# Patient Record
Sex: Female | Born: 1955 | Race: Black or African American | Hispanic: No | Marital: Married | Smoking: Never smoker
Health system: Southern US, Community
[De-identification: ages and names within clinical notes are randomized; demographics above are authoritative.]

## PROBLEM LIST (undated history)

## (undated) DIAGNOSIS — Z860101 Personal history of adenomatous and serrated colon polyps: Secondary | ICD-10-CM

## (undated) DIAGNOSIS — Z86010 Personal history of colonic polyps: Secondary | ICD-10-CM

## (undated) DIAGNOSIS — R3915 Urgency of urination: Secondary | ICD-10-CM

## (undated) DIAGNOSIS — S92351A Displaced fracture of fifth metatarsal bone, right foot, initial encounter for closed fracture: Secondary | ICD-10-CM

## (undated) DIAGNOSIS — F419 Anxiety disorder, unspecified: Secondary | ICD-10-CM

## (undated) DIAGNOSIS — G35 Multiple sclerosis: Secondary | ICD-10-CM

## (undated) DIAGNOSIS — G709 Myoneural disorder, unspecified: Secondary | ICD-10-CM

## (undated) DIAGNOSIS — I471 Supraventricular tachycardia: Secondary | ICD-10-CM

## (undated) DIAGNOSIS — Z8742 Personal history of other diseases of the female genital tract: Secondary | ICD-10-CM

## (undated) DIAGNOSIS — N201 Calculus of ureter: Secondary | ICD-10-CM

## (undated) HISTORY — DX: Multiple sclerosis: G35

## (undated) HISTORY — PX: COLONOSCOPY: SHX174

## (undated) HISTORY — DX: Supraventricular tachycardia: I47.1

---

## 1999-02-07 ENCOUNTER — Ambulatory Visit (HOSPITAL_COMMUNITY): Admission: RE | Admit: 1999-02-07 | Discharge: 1999-02-07 | Payer: Self-pay | Admitting: Gastroenterology

## 1999-11-03 ENCOUNTER — Other Ambulatory Visit: Admission: RE | Admit: 1999-11-03 | Discharge: 1999-11-03 | Payer: Self-pay | Admitting: Obstetrics and Gynecology

## 2000-12-24 ENCOUNTER — Other Ambulatory Visit: Admission: RE | Admit: 2000-12-24 | Discharge: 2000-12-24 | Payer: Self-pay | Admitting: Gynecology

## 2001-12-29 ENCOUNTER — Other Ambulatory Visit: Admission: RE | Admit: 2001-12-29 | Discharge: 2001-12-29 | Payer: Self-pay | Admitting: Gynecology

## 2002-11-02 ENCOUNTER — Ambulatory Visit (HOSPITAL_COMMUNITY): Admission: RE | Admit: 2002-11-02 | Discharge: 2002-11-02 | Payer: Self-pay | Admitting: Neurology

## 2002-11-02 ENCOUNTER — Encounter: Payer: Self-pay | Admitting: Neurology

## 2002-11-26 ENCOUNTER — Ambulatory Visit (HOSPITAL_COMMUNITY): Admission: RE | Admit: 2002-11-26 | Discharge: 2002-11-26 | Payer: Self-pay | Admitting: Neurology

## 2003-01-01 ENCOUNTER — Other Ambulatory Visit: Admission: RE | Admit: 2003-01-01 | Discharge: 2003-01-01 | Payer: Self-pay | Admitting: Gynecology

## 2003-05-14 ENCOUNTER — Other Ambulatory Visit: Admission: RE | Admit: 2003-05-14 | Discharge: 2003-05-14 | Payer: Self-pay | Admitting: Gynecology

## 2003-10-12 ENCOUNTER — Other Ambulatory Visit: Admission: RE | Admit: 2003-10-12 | Discharge: 2003-10-12 | Payer: Self-pay | Admitting: Gynecology

## 2004-01-27 ENCOUNTER — Other Ambulatory Visit: Admission: RE | Admit: 2004-01-27 | Discharge: 2004-01-27 | Payer: Self-pay | Admitting: Gynecology

## 2004-02-15 ENCOUNTER — Encounter (INDEPENDENT_AMBULATORY_CARE_PROVIDER_SITE_OTHER): Payer: Self-pay | Admitting: Specialist

## 2004-02-15 ENCOUNTER — Ambulatory Visit (HOSPITAL_COMMUNITY): Admission: RE | Admit: 2004-02-15 | Discharge: 2004-02-15 | Payer: Self-pay | Admitting: Gastroenterology

## 2004-05-22 ENCOUNTER — Other Ambulatory Visit: Admission: RE | Admit: 2004-05-22 | Discharge: 2004-05-22 | Payer: Self-pay | Admitting: Gynecology

## 2005-07-10 ENCOUNTER — Other Ambulatory Visit: Admission: RE | Admit: 2005-07-10 | Discharge: 2005-07-10 | Payer: Self-pay | Admitting: Gynecology

## 2006-01-01 ENCOUNTER — Other Ambulatory Visit: Admission: RE | Admit: 2006-01-01 | Discharge: 2006-01-01 | Payer: Self-pay | Admitting: Gynecology

## 2006-03-01 ENCOUNTER — Encounter: Admission: RE | Admit: 2006-03-01 | Discharge: 2006-03-01 | Payer: Self-pay | Admitting: Family Medicine

## 2006-04-09 ENCOUNTER — Other Ambulatory Visit: Admission: RE | Admit: 2006-04-09 | Discharge: 2006-04-09 | Payer: Self-pay | Admitting: Gynecology

## 2006-07-11 ENCOUNTER — Other Ambulatory Visit: Admission: RE | Admit: 2006-07-11 | Discharge: 2006-07-11 | Payer: Self-pay | Admitting: Gynecology

## 2006-10-11 ENCOUNTER — Other Ambulatory Visit: Admission: RE | Admit: 2006-10-11 | Discharge: 2006-10-11 | Payer: Self-pay | Admitting: Gynecology

## 2007-03-04 ENCOUNTER — Encounter: Admission: RE | Admit: 2007-03-04 | Discharge: 2007-03-04 | Payer: Self-pay | Admitting: Family Medicine

## 2007-06-18 ENCOUNTER — Other Ambulatory Visit: Admission: RE | Admit: 2007-06-18 | Discharge: 2007-06-18 | Payer: Self-pay | Admitting: Gynecology

## 2007-09-08 ENCOUNTER — Other Ambulatory Visit: Admission: RE | Admit: 2007-09-08 | Discharge: 2007-09-08 | Payer: Self-pay | Admitting: Gynecology

## 2008-03-11 ENCOUNTER — Encounter: Admission: RE | Admit: 2008-03-11 | Discharge: 2008-03-11 | Payer: Self-pay | Admitting: Family Medicine

## 2008-04-15 ENCOUNTER — Encounter: Admission: RE | Admit: 2008-04-15 | Discharge: 2008-04-15 | Payer: Self-pay | Admitting: Family Medicine

## 2008-05-25 ENCOUNTER — Other Ambulatory Visit: Admission: RE | Admit: 2008-05-25 | Discharge: 2008-05-25 | Payer: Self-pay | Admitting: Gynecology

## 2008-11-18 ENCOUNTER — Other Ambulatory Visit: Admission: RE | Admit: 2008-11-18 | Discharge: 2008-11-18 | Payer: Self-pay | Admitting: Gynecology

## 2008-11-18 ENCOUNTER — Ambulatory Visit: Payer: Self-pay | Admitting: Gynecology

## 2008-11-18 ENCOUNTER — Encounter: Payer: Self-pay | Admitting: Gynecology

## 2009-03-18 ENCOUNTER — Encounter: Admission: RE | Admit: 2009-03-18 | Discharge: 2009-03-18 | Payer: Self-pay | Admitting: Family Medicine

## 2009-05-17 ENCOUNTER — Ambulatory Visit: Payer: Self-pay | Admitting: Gynecology

## 2009-05-17 ENCOUNTER — Other Ambulatory Visit: Admission: RE | Admit: 2009-05-17 | Discharge: 2009-05-17 | Payer: Self-pay | Admitting: Gynecology

## 2009-05-17 ENCOUNTER — Encounter: Payer: Self-pay | Admitting: Gynecology

## 2009-05-19 ENCOUNTER — Ambulatory Visit: Payer: Self-pay | Admitting: Gynecology

## 2009-07-14 ENCOUNTER — Encounter: Admission: RE | Admit: 2009-07-14 | Discharge: 2009-07-14 | Payer: Self-pay | Admitting: Obstetrics and Gynecology

## 2010-03-20 ENCOUNTER — Encounter: Admission: RE | Admit: 2010-03-20 | Discharge: 2010-03-20 | Payer: Self-pay | Admitting: Family Medicine

## 2011-02-12 ENCOUNTER — Other Ambulatory Visit: Payer: Self-pay | Admitting: Family Medicine

## 2011-02-12 DIAGNOSIS — Z1231 Encounter for screening mammogram for malignant neoplasm of breast: Secondary | ICD-10-CM

## 2011-03-16 NOTE — Op Note (Signed)
   NAME:  Barbara Ellison, Barbara Ellison                        ACCOUNT NO.:  [id number]   MEDICAL RECORD NO.:  [id number]                   PATIENT TYPE:  OUT   LOCATION:  MDC                                  FACILITY:  [facility]   PHYSICIAN:  Genene Churn. Love, M.D.                 DATE OF BIRTH:  01-28-1956   DATE OF PROCEDURE:  11/26/2002  DATE OF DISCHARGE:                                 OPERATIVE REPORT   CLINICAL INFORMATION:  This patient is being evaluated for the possibility  of a demyelinating disorder.   DESCRIPTION OF PROCEDURE:  The patient is being prepped and draped in left  lateral decubitus position.  Using Betadine and 1% Xylocaine the L3 and L4  interspace was entered without difficulty.  Opening pressure was 100 mmH2O  and clear, colorless CSF was obtained and sent for VDRL, angiotensin  converting enzyme, protein, glucose, cell count, diff, IgG, oligoclonal IgG.  One tube is to be held.  The patient tolerated the procedure well.                                                Genene Churn. Sandria Manly, M.D.    JML/MEDQ  D:  11/26/2002  T:  11/26/2002  Job:  [id number]

## 2011-03-16 NOTE — Op Note (Signed)
NAME:  Barbara Ellison, Barbara Ellison                        ACCOUNT NO.:  [id number]   MEDICAL RECORD NO.:  [id number]                   PATIENT TYPE:  AMB   LOCATION:  ENDO                                 FACILITY:  [facility]   PHYSICIAN:  John C. Madilyn Fireman, M.D.                 DATE OF BIRTH:  [birth year]   DATE OF PROCEDURE:  02/15/2004  DATE OF DISCHARGE:                                 OPERATIVE REPORT   PROCEDURE:  Colonoscopy.   INDICATIONS FOR PROCEDURE:  Family history of colon cancer in a first degree  relative.   DESCRIPTION OF PROCEDURE:  The patient was placed in the left lateral  decubitus position then placed on the pulse monitor with continuous low flow  oxygen delivered by nasal cannula. She was sedated with 87.5 mcg IV fentanyl  and 8 mg IV Versed. The Olympus video colonoscope was inserted into the  rectum and advanced to the cecum, confirmed by transillumination at  McBurney's point and visualization of the ileocecal valve and appendiceal  orifice. The prep was excellent. The cecum, ascending, transverse and  descending colon all appeared normal with no masses, polyps, diverticula or  other mucosal abnormalities. Within the sigmoid colon, there was seen a 5 mm  sessile polyp which was fulgurated by hot biopsy. The remainder of the  sigmoid and rectum appeared normal.  The scope was then withdrawn and the  patient returned to the recovery room in stable condition.  She tolerated  the procedure well and there were no immediate complications.   IMPRESSION:  Small sigmoid colon polyp.   PLAN:  Await histology and will probably repeat colonoscopy in five years.                                               John C. Madilyn Fireman, M.D.    JCH/MEDQ  D:  02/15/2004  T:  02/15/2004  Job:  [id number]   cc:   Chales Salmon. Abigail Miyamoto, M.D.  [address]  [place]  [place] [address]  Fax: [phone]

## 2011-03-22 ENCOUNTER — Ambulatory Visit
Admission: RE | Admit: 2011-03-22 | Discharge: 2011-03-22 | Disposition: A | Discharge status: Home / Self Care | Payer: 59 | Source: Ambulatory Visit | Attending: Family Medicine | Admitting: Family Medicine

## 2011-03-22 DIAGNOSIS — Z1231 Encounter for screening mammogram for malignant neoplasm of breast: Secondary | ICD-10-CM

## 2011-05-21 ENCOUNTER — Other Ambulatory Visit: Payer: Self-pay | Admitting: Obstetrics and Gynecology

## 2011-05-21 DIAGNOSIS — D219 Benign neoplasm of connective and other soft tissue, unspecified: Secondary | ICD-10-CM

## 2011-05-25 ENCOUNTER — Ambulatory Visit
Admission: RE | Admit: 2011-05-25 | Discharge: 2011-05-25 | Disposition: A | Discharge status: Home / Self Care | Payer: 59 | Source: Ambulatory Visit | Attending: Obstetrics and Gynecology | Admitting: Obstetrics and Gynecology

## 2011-05-25 DIAGNOSIS — D219 Benign neoplasm of connective and other soft tissue, unspecified: Secondary | ICD-10-CM

## 2011-05-25 MED ORDER — IOHEXOL 300 MG/ML  SOLN
100.0000 mL | Freq: Once | INTRAMUSCULAR | Status: AC | PRN
  Administered 2011-05-25: 100 mL via INTRAVENOUS

## 2012-02-27 ENCOUNTER — Other Ambulatory Visit: Payer: Self-pay | Admitting: Family Medicine

## 2012-02-27 DIAGNOSIS — Z1231 Encounter for screening mammogram for malignant neoplasm of breast: Secondary | ICD-10-CM

## 2012-04-04 ENCOUNTER — Ambulatory Visit
Admission: RE | Admit: 2012-04-04 | Discharge: 2012-04-04 | Disposition: A | Discharge status: Home / Self Care | Payer: 59 | Source: Ambulatory Visit | Attending: Family Medicine | Admitting: Family Medicine

## 2012-04-04 DIAGNOSIS — Z1231 Encounter for screening mammogram for malignant neoplasm of breast: Secondary | ICD-10-CM

## 2012-05-13 DIAGNOSIS — Z8719 Personal history of other diseases of the digestive system: Secondary | ICD-10-CM | POA: Insufficient documentation

## 2012-05-14 ENCOUNTER — Ambulatory Visit: Payer: Self-pay | Admitting: Obstetrics and Gynecology

## 2012-06-03 ENCOUNTER — Other Ambulatory Visit: Payer: Self-pay | Admitting: Family Medicine

## 2012-06-03 ENCOUNTER — Other Ambulatory Visit (HOSPITAL_COMMUNITY)
Admission: RE | Admit: 2012-06-03 | Discharge: 2012-06-03 | Disposition: A | Discharge status: Home / Self Care | Payer: 59 | Source: Ambulatory Visit | Attending: Family Medicine | Admitting: Family Medicine

## 2012-06-03 DIAGNOSIS — Z124 Encounter for screening for malignant neoplasm of cervix: Secondary | ICD-10-CM | POA: Insufficient documentation

## 2012-11-10 ENCOUNTER — Telehealth: Payer: Self-pay | Admitting: Pulmonary Disease

## 2012-11-11 NOTE — Telephone Encounter (Signed)
lmomtcb x1 to schedule APPT w/ SN

## 2012-11-11 NOTE — Telephone Encounter (Signed)
Per SN---ok to schedule her to see SN in the next 2-3 weeks.   She will need to bring in all of her old records from her past doctor along with copies of her old labs.  thanks

## 2012-11-11 NOTE — Telephone Encounter (Signed)
Spouse returned call. I advised per Mindy to bring meds / old records / labs and any cxr's that may have been done. She is on the schedule for 11-25-12 @ 2:30. Nothing further needed per spouse. Hazel Sams

## 2012-11-24 ENCOUNTER — Telehealth: Payer: Self-pay | Admitting: Pulmonary Disease

## 2012-11-24 NOTE — Telephone Encounter (Signed)
PT reports that she has seen Dr Beverley Fiedler as her primary md and has talked with her about the knot on her side but she has not referred her to anyone about this.  Pt informed that her insurance may not cover her seeing another primary md for the same problem.  Pt states that she is going to check on this and call us back regarding appt for tomorrow.

## 2012-11-25 ENCOUNTER — Encounter: Payer: Self-pay | Admitting: Pulmonary Disease

## 2012-11-25 ENCOUNTER — Ambulatory Visit (INDEPENDENT_AMBULATORY_CARE_PROVIDER_SITE_OTHER): Payer: 59 | Admitting: Pulmonary Disease

## 2012-11-25 VITALS — BP 114/76 | HR 74 | Temp 98.8°F | Ht 66.0 in | Wt 159.2 lb

## 2012-11-25 DIAGNOSIS — F419 Anxiety disorder, unspecified: Secondary | ICD-10-CM | POA: Insufficient documentation

## 2012-11-25 DIAGNOSIS — Z860100 Personal history of colon polyps, unspecified: Secondary | ICD-10-CM

## 2012-11-25 DIAGNOSIS — F411 Generalized anxiety disorder: Secondary | ICD-10-CM

## 2012-11-25 DIAGNOSIS — E7800 Pure hypercholesterolemia, unspecified: Secondary | ICD-10-CM

## 2012-11-25 DIAGNOSIS — G35D Multiple sclerosis, unspecified: Secondary | ICD-10-CM | POA: Insufficient documentation

## 2012-11-25 DIAGNOSIS — D179 Benign lipomatous neoplasm, unspecified: Secondary | ICD-10-CM | POA: Insufficient documentation

## 2012-11-25 DIAGNOSIS — G35 Multiple sclerosis: Secondary | ICD-10-CM

## 2012-11-25 DIAGNOSIS — Z0000 Encounter for general adult medical examination without abnormal findings: Secondary | ICD-10-CM

## 2012-11-25 DIAGNOSIS — M549 Dorsalgia, unspecified: Secondary | ICD-10-CM

## 2012-11-25 NOTE — Patient Instructions (Addendum)
Barbara Ellison, it was great meeting you today...  Today we did a baseline CXR, EKG, & blood work meant to fill in the gaps from your previous studies...    We will contact you w/ the results when avail...  We will arrange for a consultation w/ the Surgeons at [facility] regarding the Lipoma on your right side...  Call for any questions or if we can be of service in any way...  Let's plan a follow up visit in 6 months.Marland KitchenMarland Kitchen

## 2012-11-25 NOTE — Progress Notes (Addendum)
Subjective:     Patient ID: Barbara Ellison, female   DOB: 11/21/1955, 57 y.o.   MRN: [id number]  HPI 3 y/o BF, wife of Barbara Ellison, whom I have been asked to see to establish medical care; she has Hx MS followed by DrPharr at [facility], Hyperchol, & some Anxiety> see prob list below...  ~  November 25, 2012:  New pt appt to establish care; pleasant 57 y/o woman w/ problems as outlined below>> LABS 8/13 at [facility]:  FLP at goals on Lip10;  CMet- wnl... LABS 9/13 at [facility]:  CBC- Hg=12.3 WBC=4.3 Plat=154K;  LFTs=wnl;  VitD=46 CXR 1/14 showed normal heart size, clear lungs, NAD.Marland KitchenMarland Kitchen EKG 1/14 showed NSR, rate79, LAD, ?LBBB, poor R progression ==> we will sched 2DEcho & consider Cards eval... LABS 1/14 here:  CBC- wnl w/ Hg=12.9;  TSH=0.88   Addendum>> 2/14> 2DEcho showed normal LV size & function w/ EF=55-60%, no regional wall motion abnormalities, no DD, paradoxical septal motion, mild MR...         Problem List:    ABNORMAL EKG >> baseline EKG w/ NSR, rate 79, LAD, ?LBBB vs IVCD, & poor R progression; she denies CP, palpit, dizzy, syncope, SOB, edema... ~  2DEcho 2/14:  normal LV size & function w/ EF=55-60%, no regional wall motion abnormalities, no DD, paradoxical septal motion, mild MR...  Hypercholesterolemia >> prev followed by Dr. Barton Fanny at [facility] on LIPITOR 10mg /d & low chol/ low fat diet... ~  FLP 8/13 at [facility] showed TChol 157, TG 77, HDL 54, LDL 85  GI- Colon Polyps, Constipation >> Followed by [facility] for GI; on MIRALAX daily... ~  Colonoscopy 7/10 showed one adenomatous polyp removed & f/u colon planned 68yrs...  GYN>  Hx Pelvic Pain, Dyspareunia >> followed by [facility] for Gyn...  Back discomfort >> she has some tenderness & believes this to be due to large pendulous breasts;  She will consider plastic surg consultation for this...  Multiple Sclerosis >> she estimates a 12+year hx of relapsing remitting MS initially evaluated by [name] in [place] & now followed by  DrPharr at [facility] Q50mo on Betaseron... ~  MRI Brain 3/12 showed redemonstrated supratentorial white matter lesions consistent with the patient's history of multiple sclerosis unchanged as compared to multiple prior MRIs dating back to 08/29/2005; No contrast enhancement or restricted diffusion to suggest active demyelinating disease... ~  Seen 9/13 by DrPharr & her note is reviewed in the "care anywhere" system> stable, no new symptoms, but c/o flu-like symptoms after her Betaseron shots and some pain at the injection sites; offered switch to [place] but ultimately pt decided to continue the Betaseron & she continues to tol reasonably well.  Anxiety/ Insomnia >> on KLONOPIN 0.5mg  Bid prn, and AMBIEN-CR 12.5 Qhs prn...  LIPOMA right flank area >> we will refer her to [facility], DrHIngram for excision (he removed similar lesion from her husb)...  HEALTH MAINTENANCE:  ~  GI:  Followed by DrJHayes- last colonoscopy was 7/10 w/ one tubular adenoma removed; f/u due 7/15... ~  GYN:  Followed by [facility] & DrRankin- last PAP 8/13 was neg;  Last mammogram at [facility] 6/13 was neg... ~  Immuniz:  She declines the 2013 Flu vaccine;  She had a TDAP 8/13;  Discussed indications for Shingles vaccine...   No past surgical history on file.   Outpatient Encounter Prescriptions as of 11/25/2012  Medication Sig Dispense Refill  . atorvastatin (LIPITOR) 10 MG tablet Take 10 mg by mouth daily.      Marland Kitchen  cholecalciferol (VITAMIN D-400) 400 UNITS TABS Take 400 Units by mouth daily.      . clonazePAM (KLONOPIN) 0.5 MG tablet Take 0.5 mg by mouth 3 (three) times daily as needed.      . folic acid (FOLVITE) 400 MCG tablet Take 400 mcg by mouth daily.      . Interferon Beta-1b (BETASERON) 0.3 MG KIT injection Inject 0.25 mg into the skin every other day.      . oxybutynin (DITROPAN) 5 MG tablet Take 5 mg by mouth 3 (three) times daily as needed.      . vitamin E 400 UNIT capsule Take 400 Units by mouth daily.      Marland Kitchen zolpidem  (AMBIEN CR) 12.5 MG CR tablet Take 12.5 mg by mouth at bedtime as needed.        No Known Allergies   Family History  Problem Relation Age of Onset  . Cancer Mother     Uterine ca  . Heart disease Father     Heart Attack  . Hypertension Brother   . Stroke Maternal Aunt   . Diabetes Paternal Aunt     History   Social History  . Marital Status: Married    Spouse Name: Barbara Ellison    Number of Children: N/A  . Years of Education: N/A   Occupational History  .     Social History Main Topics  . Smoking status: Never Smoker   . Smokeless tobacco: None  . Alcohol Use: Yes  . Drug Use: No  . Sexually Active: Yes    Birth Control/ Protection: Coitus interruptus, Condom   Other Topics Concern  . None   Social History Narrative  . None    Current Medications, Allergies, Past Medical History, Past Surgical History, Family History, and Social History were reviewed in [employer] record.   Review of Systems    Constitutional:  Denies F/C/S, anorexia, unexpected weight change. HEENT:  No HA, visual changes, earache, nasal symptoms, sore throat, hoarseness. Resp:  No cough, sputum, hemoptysis; no SOB, tightness, wheezing. Cardio:  No CP, palpit, DOE, orthopnea, edema. GI:  Denies N/V/D/C or blood in stool; no reflux, abd pain, distention, or gas. GU:  No dysuria, freq, urgency, hematuria, or flank pain; notes occas urge incont... GYN:  Per DrHaygood & DrRankin.... MS:  Denies joint swelling, or decr ROM; notes some discomfort about the shoulders and upper back; states she has bilat knee pain intermittently but "I take vitamins & they help" Neuro:  No tremors, seizures, dizziness, syncope, weakness, numbness, gait abn. Skin:  No suspicious lesions or skin rash; she has a right flank area lipoma Heme:  No adenopathy, bruising, bleeding. Psyche: Denies confusion, sleep disturbance, hallucinations, anxiety, depression.   Objective:   Physical  Exam    Vital Signs:  Reviewed...  General:  WD, WN, 57 y/o BF in NAD; alert & oriented; pleasant & cooperative... HEENT:  [place]/AT; Conjunctiva- pink, Sclera- nonicteric, EOM-wnl, PERRLA, Fundi-benign; EACs-clear, TMs-wnl; NOSE-clear; THROAT-clear & wnl. Neck:  Supple w/ full ROM; no JVD; normal carotid impulses w/o bruits; no thyromegaly or nodules palpated; no lymphadenopathy. Chest:  Clear to P & A; without wheezes, rales, or rhonchi heard. Heart:  Regular Rhythm; norm S1 & S2 without murmurs, rubs, or gallops detected. Abdomen:  Soft & nontender- no guarding or rebound; normal bowel sounds; no organomegaly or masses palpated. Ext:  Normal ROM; without deformities or arthritic changes; no varicose veins, venous insuffic, or edema;  Pulses intact  w/o bruits. Neuro:  CNs II-XII intact; motor testing normal; sensory testing normal; gait normal & balance OK. Derm:  Mod sized right flank area lipoma palpated Lymph:  No cervical, supraclavicular, axillary, or inguinal adenopathy palpated.  RADIOLOGY DATA:  Reviewed in the EPIC EMR & discussed w/ the patient...  LABORATORY DATA:  Reviewed in the EPIC EMR & discussed w/ the patient...   Assessment:     CPX>>   ABNORMAL EKG>  EKG w/ LAD, ?LBBB vs IVCD, poor R progression; she is asymptomatic; we will check 2DEcho=> normal LV function...  CHOL>  On Liptor10 & last FLP looked good, at goals, continue same...  Colon Polyps, Constip>  Stable on Miralax daily & she is up to date on screening colonoscopies [facility]...  GYN>  Followed by [facility]...  Back discomfort>  She may want to pursue breast reduction surgery if the discomfort gets any worse...  Multiple Sclerosis>  Stable on Betaseron; [facility] at [facility]...  Anxiety/ Insomnia>  Stable on Klonopin & Ambien CR...  LIPOMA on right flank>  We will refer to [facility]- DrHIngram for excision...     Plan:     Patient's Medications  New Prescriptions   No medications on file   Previous Medications   ATORVASTATIN (LIPITOR) 10 MG TABLET    Take 10 mg by mouth daily.   CHOLECALCIFEROL (VITAMIN D-400) 400 UNITS TABS    Take 400 Units by mouth daily.   CLONAZEPAM (KLONOPIN) 0.5 MG TABLET    Take 0.5 mg by mouth 3 (three) times daily as needed.   FOLIC ACID (FOLVITE) 400 MCG TABLET    Take 400 mcg by mouth daily.   INTERFERON BETA-1B (BETASERON) 0.3 MG KIT INJECTION    Inject 0.25 mg into the skin every other day.   OXYBUTYNIN (DITROPAN) 5 MG TABLET    Take 5 mg by mouth 3 (three) times daily as needed.   VITAMIN E 400 UNIT CAPSULE    Take 400 Units by mouth daily.   ZOLPIDEM (AMBIEN CR) 12.5 MG CR TABLET    Take 12.5 mg by mouth at bedtime as needed.  Modified Medications   No medications on file  Discontinued Medications   No medications on file

## 2012-11-28 ENCOUNTER — Other Ambulatory Visit (INDEPENDENT_AMBULATORY_CARE_PROVIDER_SITE_OTHER): Payer: 59

## 2012-11-28 ENCOUNTER — Ambulatory Visit (INDEPENDENT_AMBULATORY_CARE_PROVIDER_SITE_OTHER)
Admission: RE | Admit: 2012-11-28 | Discharge: 2012-11-28 | Disposition: A | Discharge status: Home / Self Care | Payer: 59 | Source: Ambulatory Visit | Attending: Pulmonary Disease | Admitting: Pulmonary Disease

## 2012-11-28 DIAGNOSIS — Z0000 Encounter for general adult medical examination without abnormal findings: Secondary | ICD-10-CM

## 2012-11-28 LAB — CBC WITH DIFFERENTIAL/PLATELET
Basophils Absolute: 0 10*3/uL (ref 0.0–0.1)
Basophils Relative: 0.7 % (ref 0.0–3.0)
Eosinophils Absolute: 0 10*3/uL (ref 0.0–0.7)
Lymphocytes Relative: 35.4 % (ref 12.0–46.0)
MCHC: 33.7 g/dL (ref 30.0–36.0)
MCV: 84.6 fl (ref 78.0–100.0)
Monocytes Absolute: 0.5 10*3/uL (ref 0.1–1.0)
Neutrophils Relative %: 54 % (ref 43.0–77.0)
Platelets: 164 10*3/uL (ref 150.0–400.0)
RBC: 4.55 Mil/uL (ref 3.87–5.11)

## 2012-11-28 LAB — TSH: TSH: 0.88 u[IU]/mL (ref 0.35–5.50)

## 2012-12-01 ENCOUNTER — Telehealth: Payer: Self-pay | Admitting: Pulmonary Disease

## 2012-12-01 NOTE — Telephone Encounter (Signed)
I spoke with the pt and she has questions and concerns about what her visit with Dr. Kriste Basque on 11-25-12 is being billed as a annual physical exam. I transferred her to Jasmine December to discuss this. Carron Curie, CMA

## 2012-12-05 ENCOUNTER — Telehealth: Payer: Self-pay | Admitting: Pulmonary Disease

## 2012-12-05 DIAGNOSIS — R9431 Abnormal electrocardiogram [ECG] [EKG]: Secondary | ICD-10-CM

## 2012-12-05 NOTE — Telephone Encounter (Signed)
Called and spoke with pt and she is aware of lab, ekg and cxr results.  Pt is aware that we have put in order for the echo to be scheduled. Nothing further is needed.

## 2012-12-09 ENCOUNTER — Ambulatory Visit (INDEPENDENT_AMBULATORY_CARE_PROVIDER_SITE_OTHER): Payer: Self-pay | Admitting: General Surgery

## 2012-12-11 ENCOUNTER — Ambulatory Visit (INDEPENDENT_AMBULATORY_CARE_PROVIDER_SITE_OTHER): Payer: 59 | Admitting: General Surgery

## 2012-12-12 ENCOUNTER — Other Ambulatory Visit (HOSPITAL_COMMUNITY): Payer: 59

## 2012-12-13 ENCOUNTER — Other Ambulatory Visit: Payer: Self-pay

## 2012-12-18 ENCOUNTER — Ambulatory Visit (INDEPENDENT_AMBULATORY_CARE_PROVIDER_SITE_OTHER): Payer: 59 | Admitting: General Surgery

## 2012-12-23 ENCOUNTER — Ambulatory Visit (INDEPENDENT_AMBULATORY_CARE_PROVIDER_SITE_OTHER): Payer: 59 | Admitting: General Surgery

## 2012-12-23 ENCOUNTER — Encounter (INDEPENDENT_AMBULATORY_CARE_PROVIDER_SITE_OTHER): Payer: Self-pay | Admitting: General Surgery

## 2012-12-23 VITALS — BP 128/70 | HR 72 | Temp 97.3°F | Resp 16 | Ht 66.0 in | Wt 158.2 lb

## 2012-12-23 DIAGNOSIS — D179 Benign lipomatous neoplasm, unspecified: Secondary | ICD-10-CM

## 2012-12-23 NOTE — Progress Notes (Signed)
Patient ID: Barbara Ellison, female   DOB: 22-Feb-1956, 57 y.o.   MRN: [id number]  Chief Complaint  Patient presents with  . Lipoma    HPI Barbara Ellison is a 57 y.o. female.  She is referred by Dr. Lorin Picket in detail for evaluation and surgical management of an enlarging soft tissue mass of the right flank.  The patient has noticed a palpable mass in her right flank for greater than 10 years. She says it has doubled in size in the past year. It is somewhat uncomfortable when she leans against it and also sometimes gets her brought strap. She has never had an operation before.  Comorbidities include multiple sclerosis, followed by Dr.Pharr at [facility]. Hyperlipidemia. Anxiety.  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 No strenuous work  HPI  Past Medical History  Diagnosis Date  . Yeast infection   . H/O constipation 1996  . H/O measles   . Abnormal Pap smear   . Dyspareunia, female 2011  . Pelvic pain in female 05/21/11  . Multiple sclerosis 2001 - approximate  . Lipoma     right side of torso    History reviewed. No pertinent past surgical history.  Family History  Problem Relation Age of Onset  . Cancer Mother     Uterine ca  . Heart disease Father     Heart Attack  . Hypertension Brother   . Stroke Maternal Aunt   . Diabetes Paternal Aunt   . Diabetes Brother   . Cancer Brother   . Hypertension Brother     Social History History  Substance Use Topics  . Smoking status: Never Smoker   . Smokeless tobacco: Never Used  . Alcohol Use: Yes     Comment: occasional glass of wine    No Known Allergies  Current Outpatient Prescriptions  Medication Sig Dispense Refill  . Interferon Beta-1b (BETASERON) 0.3 MG KIT injection Inject 0.25 mg into the skin every other day.      Marland Kitchen atorvastatin (LIPITOR) 10 MG tablet Take 10 mg by mouth daily.      . cholecalciferol (VITAMIN D-400) 400 UNITS TABS Take 400 Units by mouth daily.      .  clonazePAM (KLONOPIN) 0.5 MG tablet Take 0.5 mg by mouth 3 (three) times daily as needed.      . folic acid (FOLVITE) 400 MCG tablet Take 400 mcg by mouth daily.      Marland Kitchen oxybutynin (DITROPAN) 5 MG tablet Take 5 mg by mouth 3 (three) times daily as needed.      . vitamin E 400 UNIT capsule Take 400 Units by mouth daily.      Marland Kitchen zolpidem (AMBIEN CR) 12.5 MG CR tablet Take 12.5 mg by mouth at bedtime as needed.       No current facility-administered medications for this visit.    Review of Systems Review of Systems  Constitutional: Negative for fever, chills and unexpected weight change.  HENT: Negative for hearing loss, congestion, sore throat, trouble swallowing and voice change.   Eyes: Negative for visual disturbance.  Respiratory: Negative for cough and wheezing.   Cardiovascular: Negative for chest pain, palpitations and leg swelling.  Gastrointestinal: Negative for nausea, vomiting, abdominal pain, diarrhea, constipation, blood in stool, abdominal distention and anal bleeding.  Genitourinary: Negative for hematuria, vaginal bleeding and difficulty urinating.  Musculoskeletal: Negative for arthralgias.  Skin: Negative for rash and wound.  Neurological: Negative for seizures, syncope and headaches.  Hematological: Negative for adenopathy. Does not bruise/bleed easily.  Psychiatric/Behavioral: Negative for confusion.    Blood pressure 128/70, pulse 72, temperature 97.3 F (36.3 C), temperature source Temporal, resp. rate 16, height 5\' 6"  (1.676 m), weight 158 lb 4 oz (71.782 kg).  Physical Exam Physical Exam  Constitutional: She is oriented to person, place, and time. She appears well-developed and well-nourished. No distress.  HENT:  Head: Normocephalic and atraumatic.  Nose: Nose normal.  Mouth/Throat: No oropharyngeal exudate.  Eyes: Conjunctivae and EOM are normal. Pupils are equal, round, and reactive to light. Left eye exhibits no discharge. No scleral icterus.  Neck: Neck  supple. No JVD present. No tracheal deviation present. No thyromegaly present.  Cardiovascular: Normal rate, regular rhythm, normal heart sounds and intact distal pulses.   No murmur heard. Pulmonary/Chest: Effort normal and breath sounds normal. No respiratory distress. She has no wheezes. She has no rales. She exhibits no tenderness.  Abdominal: Soft. Bowel sounds are normal. She exhibits no distension and no mass. There is no tenderness. There is no rebound and no guarding.  Musculoskeletal: She exhibits no edema and no tenderness.  Lymphadenopathy:    She has no cervical adenopathy.  Neurological: She is alert and oriented to person, place, and time. She exhibits normal muscle tone. Coordination normal.  Skin: Skin is warm. No rash noted. She is not diaphoretic. No erythema. No pallor.  3 cm x 5 cm soft tissue mass right flank, midaxillary line overlying lower ribs. Texture and consistency suggest lipoma. No axillary adenopathy.  Psychiatric: She has a normal mood and affect. Her behavior is normal. Judgment and thought content normal.    Data Reviewed Dr. Micah Flesher office notes  Assessment    5 cm soft tissue mass right flank, suspect lipoma  Multiple sclerosis  Hyperlipidemia  Mild anxiety     Plan    The patient would like this removed, and so she will be scheduled for elective excision of this mass under anesthesia. I think this would be a little problematic to do under local anesthesia because of the size of the field block required. I discussed this with her, she is in full agreement.  I discussed the indications, details, techniques, and numerous risk of the surgery with her. She understands all these issues. All of her questions are answered.  She agrees with this plan.        Angelia Mould. Derrell Lolling, M.D., [facility] General and Minimally invasive Surgery Breast and Colorectal Surgery Office:   [phone] Pager:    [phone]  12/23/2012, 2:36 PM

## 2012-12-23 NOTE — Patient Instructions (Addendum)
The lump on your right flank is approximately 5 cm x 3 cm in size. It feels like a benign lipoma.  We will schedule you for a small operation to completely remove this lipoma under anesthesia in the near future.  Please call when you are ready to schedule the surgery.

## 2012-12-24 ENCOUNTER — Ambulatory Visit (HOSPITAL_COMMUNITY): Payer: 59 | Attending: Cardiology | Admitting: Radiology

## 2012-12-24 DIAGNOSIS — I059 Rheumatic mitral valve disease, unspecified: Secondary | ICD-10-CM | POA: Insufficient documentation

## 2012-12-24 DIAGNOSIS — E785 Hyperlipidemia, unspecified: Secondary | ICD-10-CM | POA: Insufficient documentation

## 2012-12-24 DIAGNOSIS — R9431 Abnormal electrocardiogram [ECG] [EKG]: Secondary | ICD-10-CM

## 2012-12-24 DIAGNOSIS — Z8249 Family history of ischemic heart disease and other diseases of the circulatory system: Secondary | ICD-10-CM | POA: Insufficient documentation

## 2012-12-24 HISTORY — PX: TRANSTHORACIC ECHOCARDIOGRAM: SHX275

## 2012-12-24 NOTE — Progress Notes (Signed)
Echocardiogram performed.  

## 2013-02-24 ENCOUNTER — Other Ambulatory Visit: Payer: Self-pay

## 2013-02-24 DIAGNOSIS — Z1231 Encounter for screening mammogram for malignant neoplasm of breast: Secondary | ICD-10-CM

## 2013-02-26 ENCOUNTER — Telehealth (INDEPENDENT_AMBULATORY_CARE_PROVIDER_SITE_OTHER): Payer: Self-pay

## 2013-02-26 ENCOUNTER — Other Ambulatory Visit (INDEPENDENT_AMBULATORY_CARE_PROVIDER_SITE_OTHER): Payer: Self-pay | Admitting: General Surgery

## 2013-02-26 DIAGNOSIS — R229 Localized swelling, mass and lump, unspecified: Secondary | ICD-10-CM

## 2013-02-26 HISTORY — PX: OTHER SURGICAL HISTORY: SHX169

## 2013-02-26 NOTE — Telephone Encounter (Signed)
Pharmacy called for dosage amount of Vicodin Rx.  No documentation found.  Given instructions for Vicodin 5/325mg  #30 w/ no refills.

## 2013-03-02 ENCOUNTER — Telehealth (INDEPENDENT_AMBULATORY_CARE_PROVIDER_SITE_OTHER): Payer: Self-pay | Admitting: *Deleted

## 2013-03-02 NOTE — Telephone Encounter (Signed)
Patient called to ask about a stronger pain medication to help her sleep.  Patient states the Hydrocodone says it will make her drowsy but it is not helping her sleep.  Explained to patient that it should not be used as a sleep aid but to help with pain management following a surgical procedure.  Suggested patient use an OTC sleep aid if she feels it necessary.  Patient states understanding at this time and agreeable.

## 2013-03-03 ENCOUNTER — Telehealth (INDEPENDENT_AMBULATORY_CARE_PROVIDER_SITE_OTHER): Payer: Self-pay

## 2013-03-03 NOTE — Telephone Encounter (Signed)
I called and let the pt know her results were benign.

## 2013-03-03 NOTE — Progress Notes (Signed)
Quick Note:  Inform patient of Pathology report,. Benign lipoma. ______

## 2013-03-03 NOTE — Telephone Encounter (Signed)
Message copied by Ivory Broad on Tue Mar 03, 2013 10:01 AM ------      Message from: Ernestene Mention      Created: Tue Mar 03, 2013  5:16 AM       Inform patient of Pathology report,. Benign lipoma. ------

## 2013-03-12 ENCOUNTER — Ambulatory Visit (INDEPENDENT_AMBULATORY_CARE_PROVIDER_SITE_OTHER): Payer: 59 | Admitting: General Surgery

## 2013-03-12 ENCOUNTER — Encounter (INDEPENDENT_AMBULATORY_CARE_PROVIDER_SITE_OTHER): Payer: Self-pay | Admitting: General Surgery

## 2013-03-12 VITALS — BP 120/88 | HR 72 | Temp 97.1°F | Resp 12 | Ht 66.0 in | Wt 161.0 lb

## 2013-03-12 DIAGNOSIS — D179 Benign lipomatous neoplasm, unspecified: Secondary | ICD-10-CM

## 2013-03-12 NOTE — Progress Notes (Signed)
Patient ID: Lattie Corns, female   DOB: 07-09-1956, 57 y.o.   MRN: [id number] History: This patient underwent excision of a histologically confirmed 5 cm benign lipoma from the right chest wall on May 1. She has done well. It is still sore.  Exam: Incision right lateral chest wall is healing normally. Healthy skin. No infection. No hematoma. No unusual tenderness  Assessment 5 cm benign lipoma right chest wall, recovering uneventfully following excision  Plan: Resume normal activities. Avoid impact for 2 more weeks. Return to see me if there are problems.   Angelia Mould. Derrell Lolling, M.D., [facility] General and Minimally invasive Surgery Breast and Colorectal Surgery Office:   [phone] Pager:   [phone]

## 2013-03-12 NOTE — Patient Instructions (Signed)
The wound on your right chest wall is healing normally. There is no sign of any infection or blood clot. The pain will slowly go away over the next fewweeks.  You may exercise  The final pathology report shows a benign lipoma. You have been given this report today.  Return to see Dr. Derrell Lolling in further problems arise.

## 2013-04-06 ENCOUNTER — Ambulatory Visit
Admission: RE | Admit: 2013-04-06 | Discharge: 2013-04-06 | Disposition: A | Discharge status: Home / Self Care | Payer: 59 | Source: Ambulatory Visit

## 2013-04-06 DIAGNOSIS — Z1231 Encounter for screening mammogram for malignant neoplasm of breast: Secondary | ICD-10-CM

## 2013-08-14 ENCOUNTER — Encounter: Payer: Self-pay | Admitting: Adult Health

## 2013-08-14 ENCOUNTER — Ambulatory Visit (INDEPENDENT_AMBULATORY_CARE_PROVIDER_SITE_OTHER): Payer: 59 | Admitting: Adult Health

## 2013-08-14 ENCOUNTER — Other Ambulatory Visit (INDEPENDENT_AMBULATORY_CARE_PROVIDER_SITE_OTHER): Payer: 59

## 2013-08-14 VITALS — BP 122/78 | HR 64 | Temp 97.0°F | Ht 66.0 in | Wt 159.0 lb

## 2013-08-14 DIAGNOSIS — R609 Edema, unspecified: Secondary | ICD-10-CM

## 2013-08-14 DIAGNOSIS — M25473 Effusion, unspecified ankle: Secondary | ICD-10-CM | POA: Insufficient documentation

## 2013-08-14 LAB — BRAIN NATRIURETIC PEPTIDE: Pro B Natriuretic peptide (BNP): 16 pg/mL (ref 0.0–100.0)

## 2013-08-14 LAB — BASIC METABOLIC PANEL
Calcium: 9.5 mg/dL (ref 8.4–10.5)
GFR: 153.41 mL/min (ref >60.00)
Glucose, Bld: 95 mg/dL (ref 70–99)
Potassium: 4 mEq/L (ref 3.5–5.1)
Sodium: 142 mEq/L (ref 135–145)

## 2013-08-14 NOTE — Progress Notes (Signed)
Subjective:     Patient ID: Barbara Ellison, female   DOB: 1956/09/10, 57 y.o.   MRN: [id number]  HPI  2 y/o BF, wife of [name],  Hx MS followed by DrPharr at [facility], Hyperchol, & some Anxiety> see prob list below...  08/14/2013 Acute OV  Complains of ankle swelling for past several months - Worse during the day - Denies sob, chest pain or tightness Worse in pm, sometimes. Puffy spot along back of ankle bone.  No pain, injury, new meds, recent travel, calf pain , redness, orthopnea, chest pain or dyspnea.  Takes aleve when she does her MS injections most day.         Problem List:    ABNORMAL EKG >> baseline EKG w/ NSR, rate 79, LAD, ?LBBB vs IVCD, & poor R progression; she denies CP, palpit, dizzy, syncope, SOB, edema... ~  2DEcho 2/14:  normal LV size & function w/ EF=55-60%, no regional wall motion abnormalities, no DD, paradoxical septal motion, mild MR...  Hypercholesterolemia >> prev followed by Dr. Barton Fanny at [facility] on LIPITOR 10mg /d & low chol/ low fat diet... ~  FLP 8/13 at [facility] showed TChol 157, TG 77, HDL 54, LDL 85  GI- Colon Polyps, Constipation >> Followed by [facility] for GI; on MIRALAX daily... ~  Colonoscopy 7/10 showed one adenomatous polyp removed & f/u colon planned 85yrs...  GYN>  Hx Pelvic Pain, Dyspareunia >> followed by [facility] for Gyn...  Back discomfort >> she has some tenderness & believes this to be due to large pendulous breasts;  She will consider plastic surg consultation for this...  Multiple Sclerosis >> she estimates a 12+year hx of relapsing remitting MS initially evaluated by [name] in [place] & now followed by DrPharr at [facility] Q2mo on Betaseron... ~  MRI Brain 3/12 showed redemonstrated supratentorial white matter lesions consistent with the patient's history of multiple sclerosis unchanged as compared to multiple prior MRIs dating back to 08/29/2005; No contrast enhancement or restricted diffusion to suggest active demyelinating  disease... ~  Seen 9/13 by DrPharr & her note is reviewed in the "care anywhere" system> stable, no new symptoms, but c/o flu-like symptoms after her Betaseron shots and some pain at the injection sites; offered switch to [place] but ultimately pt decided to continue the Betaseron & she continues to tol reasonably well.  Anxiety/ Insomnia >> on KLONOPIN 0.5mg  Bid prn, and AMBIEN-CR 12.5 Qhs prn...  LIPOMA right flank area >> we will refer her to [facility], DrHIngram for excision (he removed similar lesion from her husb)...  HEALTH MAINTENANCE:  ~  GI:  Followed by DrJHayes- last colonoscopy was 7/10 w/ one tubular adenoma removed; f/u due 7/15... ~  GYN:  Followed by [facility] & DrRankin- last PAP 8/13 was neg;  Last mammogram at [facility] 6/13 was neg... ~  Immuniz:  She declines the 2013 Flu vaccine;  She had a TDAP 8/13;  Discussed indications for Shingles vaccine...   History reviewed. No pertinent past surgical history.   Outpatient Encounter Prescriptions as of 08/14/2013  Medication Sig Dispense Refill  . atorvastatin (LIPITOR) 10 MG tablet Take 10 mg by mouth daily.      . cholecalciferol (VITAMIN D-400) 400 UNITS TABS Take 400 Units by mouth daily.      . clonazePAM (KLONOPIN) 0.5 MG tablet Take 0.5 mg by mouth 3 (three) times daily as needed.      . folic acid (FOLVITE) 400 MCG tablet Take 400 mcg by mouth daily.      Marland Kitchen  Interferon Beta-1b (BETASERON) 0.3 MG KIT injection Inject 0.25 mg into the skin every other day.      . oxybutynin (DITROPAN) 5 MG tablet Take 5 mg by mouth 3 (three) times daily as needed.      . vitamin E 400 UNIT capsule Take 400 Units by mouth daily.      Marland Kitchen zolpidem (AMBIEN CR) 12.5 MG CR tablet Take 12.5 mg by mouth at bedtime as needed.       No facility-administered encounter medications on file as of 08/14/2013.    No Known Allergies   Family History  Problem Relation Age of Onset  . Cancer Mother     Uterine ca  . Heart disease Father     Heart  Attack  . Hypertension Brother   . Stroke Maternal Aunt   . Diabetes Paternal Aunt   . Diabetes Brother   . Cancer Brother   . Hypertension Brother     History   Social History  . Marital Status: Married    Spouse Name: [name]    Number of Children: N/A  . Years of Education: N/A   Occupational History  .     Social History Main Topics  . Smoking status: Never Smoker   . Smokeless tobacco: Never Used  . Alcohol Use: Yes     Comment: occasional glass of wine  . Drug Use: No  . Sexual Activity: Yes    Birth Control/ Protection: Coitus interruptus, Condom   Other Topics Concern  . None   Social History Narrative  . None    Current Medications, Allergies, Past Medical History, Past Surgical History, Family History, and Social History were reviewed in [employer] record.   Review of Systems     Constitutional:  Denies F/C/S, anorexia, unexpected weight change. HEENT:  No HA, visual changes, earache, nasal symptoms, sore throat, hoarseness. Resp:  No cough, sputum, hemoptysis; no SOB, tightness, wheezing. Cardio:  No CP, palpit, DOE, orthopnea, +ankle swelling  GI:  Denies N/V/D/C or blood in stool; no reflux, abd pain, distention, or gas. GU:  No dysuria, freq, urgency, hematuria, or flank pain;   GYN:  Per DrHaygood & DrRankin.... MS:  Denies joint swelling, or decr ROM;  Neuro:  No tremors, seizures, dizziness, syncope, weakness, numbness, gait abn. Skin:  No suspicious lesions or skin rash;   Heme:  No adenopathy, bruising, bleeding. Psyche: Denies confusion, sleep disturbance, hallucinations, anxiety, depression.   Objective:   Physical Exam     Vital Signs:  Reviewed...  General:  WD, WN, 57 y/o BF in NAD; alert & oriented; pleasant & cooperative... HEENT:  [place]/AT; Conjunctiva- pink, Sclera- nonicteric, EOM-wnl,  EACs-clear, TMs-wnl; NOSE-clear; THROAT-clear & wnl. Neck:  Supple w/ full ROM; no JVD; normal carotid impulses w/o  bruits; no thyromegaly or nodules palpated; no lymphadenopathy. Chest:  Clear to P & A; without wheezes, rales, or rhonchi heard. Heart:  Regular Rhythm; norm S1 & S2 without murmurs, rubs, or gallops detected. , trace edema along ankles , neg homans sign , few varicosities noted  Abdomen:  Soft & nontender- no guarding or rebound; normal bowel sounds; no organomegaly or masses palpated. Ext:  Normal ROM; without deformities or arthritic changes; no varicose veins, venous insuffic, or edema;  Pulses intact w/o bruits. Neuro:   gait normal & balance OK.  Lymph:  No cervical, supraclavicular, axillary, or inguinal adenopathy palpated.  RADIOLOGY DATA:  Reviewed in the EPIC EMR & discussed w/ the  patient...  LABORATORY DATA:  Reviewed in the EPIC EMR & discussed w/ the patient...   Assessment:

## 2013-08-14 NOTE — Patient Instructions (Addendum)
I will call with Lab results. Keep legs elevated as much as possible. Low salt diet. Avoid nonsteroidal, anti-inflammatories, i.e., Aleve as much as possible.  Follow up Dr. Kriste Basque   In 3 months and As needed   Please contact office for sooner follow up if symptoms do not improve or worsen or seek emergency care

## 2013-08-14 NOTE — Addendum Note (Signed)
Addended by: Abigail Miyamoto D on: 08/14/2013 10:23 AM   Modules accepted: Orders

## 2013-08-14 NOTE — Assessment & Plan Note (Addendum)
Bilateral ankle edema , mild suspect secondary to Venous insufficiency  Exam otherwise unremarkable  Advised to limit salt intake, and NSAIDs  Check labs w/ bnp   Plan  I will call with Lab results. Keep legs elevated as much as possible. Low salt diet. Avoid nonsteroidal, anti-inflammatories, i.e., Aleve as much as possible.  Follow up Dr. Kriste Basque   In 3 months and As needed

## 2013-08-21 NOTE — Progress Notes (Signed)
Quick Note:  LMOM TCB x2. ______ 

## 2013-08-21 NOTE — Progress Notes (Signed)
Quick Note:  Called, spoke with pt. Informed her of lab results and recs per TP. She verbalized understanding and voiced no further questions or concerns at this time. ______

## 2013-09-03 ENCOUNTER — Other Ambulatory Visit: Payer: Self-pay

## 2013-11-16 ENCOUNTER — Ambulatory Visit: Payer: 59 | Admitting: Pulmonary Disease

## 2013-12-03 ENCOUNTER — Other Ambulatory Visit (HOSPITAL_COMMUNITY)
Admission: RE | Admit: 2013-12-03 | Discharge: 2013-12-03 | Disposition: A | Discharge status: Home / Self Care | Payer: 59 | Source: Ambulatory Visit | Attending: Family Medicine | Admitting: Family Medicine

## 2013-12-03 ENCOUNTER — Other Ambulatory Visit: Payer: Self-pay | Admitting: Family Medicine

## 2013-12-03 DIAGNOSIS — Z01419 Encounter for gynecological examination (general) (routine) without abnormal findings: Secondary | ICD-10-CM | POA: Insufficient documentation

## 2013-12-03 DIAGNOSIS — R87810 Cervical high risk human papillomavirus (HPV) DNA test positive: Secondary | ICD-10-CM | POA: Insufficient documentation

## 2013-12-03 DIAGNOSIS — Z1151 Encounter for screening for human papillomavirus (HPV): Secondary | ICD-10-CM | POA: Insufficient documentation

## 2014-01-08 ENCOUNTER — Ambulatory Visit: Payer: 59 | Admitting: Pulmonary Disease

## 2014-03-15 ENCOUNTER — Other Ambulatory Visit: Payer: Self-pay

## 2014-03-15 DIAGNOSIS — Z1231 Encounter for screening mammogram for malignant neoplasm of breast: Secondary | ICD-10-CM

## 2014-04-08 ENCOUNTER — Ambulatory Visit
Admission: RE | Admit: 2014-04-08 | Discharge: 2014-04-08 | Disposition: A | Discharge status: Home / Self Care | Payer: 59 | Source: Ambulatory Visit

## 2014-04-08 DIAGNOSIS — Z1231 Encounter for screening mammogram for malignant neoplasm of breast: Secondary | ICD-10-CM

## 2014-08-30 ENCOUNTER — Encounter: Payer: Self-pay | Admitting: Adult Health

## 2015-01-27 ENCOUNTER — Encounter (HOSPITAL_COMMUNITY): Payer: Self-pay | Admitting: *Deleted

## 2015-01-27 ENCOUNTER — Emergency Department (HOSPITAL_COMMUNITY)
Admission: EM | Admit: 2015-01-27 | Discharge: 2015-01-27 | Disposition: A | Discharge status: Home / Self Care | Payer: 59 | Attending: Emergency Medicine | Admitting: Emergency Medicine

## 2015-01-27 ENCOUNTER — Emergency Department (HOSPITAL_COMMUNITY): Payer: 59

## 2015-01-27 DIAGNOSIS — Z872 Personal history of diseases of the skin and subcutaneous tissue: Secondary | ICD-10-CM | POA: Diagnosis not present

## 2015-01-27 DIAGNOSIS — Z8719 Personal history of other diseases of the digestive system: Secondary | ICD-10-CM | POA: Diagnosis not present

## 2015-01-27 DIAGNOSIS — N201 Calculus of ureter: Secondary | ICD-10-CM | POA: Insufficient documentation

## 2015-01-27 DIAGNOSIS — G35 Multiple sclerosis: Secondary | ICD-10-CM | POA: Diagnosis not present

## 2015-01-27 DIAGNOSIS — Z79899 Other long term (current) drug therapy: Secondary | ICD-10-CM | POA: Diagnosis not present

## 2015-01-27 DIAGNOSIS — Z8619 Personal history of other infectious and parasitic diseases: Secondary | ICD-10-CM | POA: Diagnosis not present

## 2015-01-27 DIAGNOSIS — R1032 Left lower quadrant pain: Secondary | ICD-10-CM | POA: Diagnosis present

## 2015-01-27 LAB — CBC WITH DIFFERENTIAL/PLATELET
Basophils Absolute: 0 10*3/uL (ref 0.0–0.1)
Basophils Relative: 0 % (ref 0–1)
Eosinophils Absolute: 0.1 10*3/uL (ref 0.0–0.7)
Eosinophils Relative: 1 % (ref 0–5)
HCT: 40.7 % (ref 36.0–46.0)
Hemoglobin: 13.3 g/dL (ref 12.0–15.0)
Lymphocytes Relative: 22 % (ref 12–46)
Lymphs Abs: 1.8 10*3/uL (ref 0.7–4.0)
MCH: 28.4 pg (ref 26.0–34.0)
MCHC: 32.7 g/dL (ref 30.0–36.0)
MCV: 86.8 fL (ref 78.0–100.0)
Monocytes Absolute: 0.5 10*3/uL (ref 0.1–1.0)
Monocytes Relative: 6 % (ref 3–12)
Neutro Abs: 5.9 10*3/uL (ref 1.7–7.7)
Neutrophils Relative %: 71 % (ref 43–77)
Platelets: 143 10*3/uL — ABNORMAL LOW (ref 150–400)
RBC: 4.69 MIL/uL (ref 3.87–5.11)
RDW: 12.9 % (ref 11.5–15.5)
WBC: 8.2 10*3/uL (ref 4.0–10.5)

## 2015-01-27 LAB — LIPASE, BLOOD: Lipase: 34 U/L (ref 11–59)

## 2015-01-27 LAB — URINE MICROSCOPIC-ADD ON

## 2015-01-27 LAB — URINALYSIS, ROUTINE W REFLEX MICROSCOPIC
Bilirubin Urine: NEGATIVE
Glucose, UA: NEGATIVE mg/dL
Ketones, ur: NEGATIVE mg/dL
Leukocytes, UA: NEGATIVE
Nitrite: NEGATIVE
Protein, ur: NEGATIVE mg/dL
Specific Gravity, Urine: 1.012 (ref 1.005–1.030)
Urobilinogen, UA: 0.2 mg/dL (ref 0.0–1.0)
pH: 8 (ref 5.0–8.0)

## 2015-01-27 LAB — COMPREHENSIVE METABOLIC PANEL
ALT: 60 U/L — ABNORMAL HIGH (ref 0–35)
AST: 64 U/L — ABNORMAL HIGH (ref 0–37)
Albumin: 4.4 g/dL (ref 3.5–5.2)
Alkaline Phosphatase: 83 U/L (ref 39–117)
Anion gap: 10 (ref 5–15)
BUN: 12 mg/dL (ref 6–23)
CO2: 26 mmol/L (ref 19–32)
Calcium: 9.2 mg/dL (ref 8.4–10.5)
Chloride: 104 mmol/L (ref 96–112)
Creatinine, Ser: 0.79 mg/dL (ref 0.50–1.10)
GFR calc Af Amer: >90 mL/min (ref >90)
GFR calc non Af Amer: >90 mL/min (ref >90)
Glucose, Bld: 123 mg/dL — ABNORMAL HIGH (ref 70–99)
Potassium: 4 mmol/L (ref 3.5–5.1)
Sodium: 140 mmol/L (ref 135–145)
Total Bilirubin: 0.5 mg/dL (ref 0.3–1.2)
Total Protein: 7.9 g/dL (ref 6.0–8.3)

## 2015-01-27 MED ORDER — IOHEXOL 300 MG/ML  SOLN
50.0000 mL | Freq: Once | INTRAMUSCULAR | Status: AC | PRN
  Administered 2015-01-27: 50 mL via ORAL

## 2015-01-27 MED ORDER — KETOROLAC TROMETHAMINE 15 MG/ML IJ SOLN
15.0000 mg | Freq: Once | INTRAMUSCULAR | Status: AC
  Administered 2015-01-27: 15 mg via INTRAVENOUS
  Filled 2015-01-27: qty 1

## 2015-01-27 MED ORDER — ONDANSETRON 8 MG PO TBDP
8.0000 mg | ORAL_TABLET | Freq: Once | ORAL | Status: AC
  Administered 2015-01-27: 8 mg via ORAL
  Filled 2015-01-27: qty 1

## 2015-01-27 MED ORDER — HYDROMORPHONE HCL 1 MG/ML IJ SOLN
0.5000 mg | Freq: Once | INTRAMUSCULAR | Status: AC
  Administered 2015-01-27: 0.5 mg via INTRAVENOUS
  Filled 2015-01-27: qty 1

## 2015-01-27 MED ORDER — IOHEXOL 300 MG/ML  SOLN
100.0000 mL | Freq: Once | INTRAMUSCULAR | Status: AC | PRN
  Administered 2015-01-27: 100 mL via INTRAVENOUS

## 2015-01-27 MED ORDER — ONDANSETRON HCL 4 MG PO TABS: 4.0000 mg | ORAL_TABLET | Freq: Four times a day (QID) | ORAL | Status: DC

## 2015-01-27 MED ORDER — HYDROMORPHONE HCL 1 MG/ML IJ SOLN
1.0000 mg | Freq: Once | INTRAMUSCULAR | Status: AC
  Administered 2015-01-27: 1 mg via INTRAVENOUS
  Filled 2015-01-27: qty 1

## 2015-01-27 MED ORDER — OXYCODONE-ACETAMINOPHEN 5-325 MG PO TABS: 1.0000 | ORAL_TABLET | ORAL | Status: DC | PRN

## 2015-01-27 MED ORDER — ONDANSETRON HCL 4 MG/2ML IJ SOLN
4.0000 mg | Freq: Once | INTRAMUSCULAR | Status: AC
  Administered 2015-01-27: 4 mg via INTRAVENOUS
  Filled 2015-01-27: qty 2

## 2015-01-27 MED ORDER — SODIUM CHLORIDE 0.9 % IV BOLUS (SEPSIS)
1000.0000 mL | Freq: Once | INTRAVENOUS | Status: AC
  Administered 2015-01-27: 1000 mL via INTRAVENOUS

## 2015-01-27 NOTE — Discharge Instructions (Signed)

## 2015-01-27 NOTE — ED Notes (Addendum)
Pt presents to ed with c/o LLQ abdominal pain radiating to back, with n/v, denies urinary symptoms, fever. When asked to describe her symptoms pt sts she doesn't understand why she needs to answer all this questions and "what does  passing gas has to do with my pain anyway. Just get me the doctor."

## 2015-01-30 NOTE — ED Provider Notes (Signed)
CSN: [id number]     Arrival date & time 01/27/15  2951 History   First MD Initiated Contact with Patient 01/27/15 [phone]     Chief Complaint  Patient presents with  . Abdominal Pain     (Consider location/radiation/quality/duration/timing/severity/associated sxs/prior Treatment) HPI   59 year old female with left lower quadrant pain. Symptom onset this morning. Acute onset. Pain is relatively constant. No appreciable exacerbating or relieving factors. Radiation straight through to back. Associated nausea and vomiting. No urinary complaints. No diarrhea. No fevers or chills. No intervention prior to arrival.  Past Medical History  Diagnosis Date  . Yeast infection   . H/O constipation 1996  . H/O measles   . Abnormal Pap smear   . Dyspareunia, female 2011  . Pelvic pain in female 05/21/11  . Multiple sclerosis 2001 - approximate  . Lipoma     right side of torso   History reviewed. No pertinent past surgical history. Family History  Problem Relation Age of Onset  . Cancer Mother     Uterine ca  . Heart disease Father     Heart Attack  . Hypertension Brother   . Stroke Maternal Aunt   . Diabetes Paternal Aunt   . Diabetes Brother   . Cancer Brother   . Hypertension Brother    History  Substance Use Topics  . Smoking status: Never Smoker   . Smokeless tobacco: Never Used  . Alcohol Use: Yes     Comment: occasional glass of wine   OB History    Gravida Para Term Preterm AB TAB SAB Ectopic Multiple Living   1              Review of Systems  All systems reviewed and negative, other than as noted in HPI.   Allergies  Review of patient's allergies indicates no known allergies.  Home Medications   Prior to Admission medications   Medication Sig Start Date End Date Taking? Authorizing Provider  atorvastatin (LIPITOR) 10 MG tablet Take 10 mg by mouth daily.   Yes Historical Provider, MD  clonazePAM (KLONOPIN) 0.5 MG tablet Take 0.5 mg by mouth 3 (three) times daily  as needed for anxiety (anxiety).    Yes Historical Provider, MD  folic acid (FOLVITE) 660 MCG tablet Take 400 mcg by mouth daily.   Yes Historical Provider, MD  Interferon Beta-1b (BETASERON) 0.3 MG KIT injection Inject 0.25 mg into the skin every other day.   Yes Historical Provider, MD  Multiple Vitamins-Minerals (WOMENS ONE DAILY PO) Take 1 tablet by mouth daily.   Yes Historical Provider, MD  vitamin E 400 UNIT capsule Take 400 Units by mouth daily.   Yes Historical Provider, MD  zolpidem (AMBIEN CR) 12.5 MG CR tablet Take 12.5 mg by mouth at bedtime as needed for sleep (sleep).    Yes Historical Provider, MD  ondansetron (ZOFRAN) 4 MG tablet Take 1 tablet (4 mg total) by mouth every 6 (six) hours. 01/27/15   Virgel Manifold, MD  oxybutynin (DITROPAN) 5 MG tablet Take 5 mg by mouth 3 (three) times daily as needed.    Historical Provider, MD  oxyCODONE-acetaminophen (PERCOCET/ROXICET) 5-325 MG per tablet Take 1-2 tablets by mouth every 4 (four) hours as needed for severe pain. 01/27/15   Virgel Manifold, MD   BP 136/75 mmHg  Pulse 85  Temp(Src) 98.2 F (36.8 C) (Oral)  Resp 18  SpO2 96% Physical Exam  Constitutional:  Sitting up in bed. Appears very uncomfortable.  HENT:  Head:  Normocephalic and atraumatic.  Eyes: Conjunctivae are normal. Right eye exhibits no discharge. Left eye exhibits no discharge.  Neck: Neck supple.  Cardiovascular: Normal rate, regular rhythm and normal heart sounds.  Exam reveals no gallop and no friction rub.   No murmur heard. Pulmonary/Chest: Effort normal and breath sounds normal. No respiratory distress.  Abdominal: Soft. She exhibits no distension. There is no tenderness.  Musculoskeletal: She exhibits no edema or tenderness.  l cva tenderness  Neurological: She is alert.  Skin: Skin is warm and dry.  Psychiatric: She has a normal mood and affect. Her behavior is normal. Thought content normal.  Nursing note and vitals reviewed.   ED Course  Procedures  (including critical care time) Labs Review Labs Reviewed  CBC WITH DIFFERENTIAL/PLATELET - Abnormal; Notable for the following:    Platelets 143 (*)    All other components within normal limits  COMPREHENSIVE METABOLIC PANEL - Abnormal; Notable for the following:    Glucose, Bld 123 (*)    AST 64 (*)    ALT 60 (*)    All other components within normal limits  URINALYSIS, ROUTINE W REFLEX MICROSCOPIC - Abnormal; Notable for the following:    Hgb urine dipstick MODERATE (*)    All other components within normal limits  LIPASE, BLOOD  URINE MICROSCOPIC-ADD ON    Imaging Review No results found.   Ct Abdomen Pelvis W Contrast  01/27/2015   CLINICAL DATA:  Left lower quadrant pain, left flank pain starting this morning of  EXAM: CT ABDOMEN AND PELVIS WITH CONTRAST  TECHNIQUE: Multidetector CT imaging of the abdomen and pelvis was performed using the standard protocol following bolus administration of intravenous contrast.  CONTRAST:  77m OMNIPAQUE IOHEXOL 300 MG/ML SOLN, 1070mOMNIPAQUE IOHEXOL 300 MG/ML SOLN  COMPARISON:  05/25/2011  FINDINGS: Lung bases are unremarkable.  Enhanced liver is unremarkable. No biliary ductal dilatation. No calcified gallstones are noted within gallbladder. Enhanced pancreas, spleen and adrenal glands are unremarkable. There is mild left hydronephrosis and proximal left hydroureter. Left perinephric stranding and small amount of left perinephric fluid is noted. Mild left proximal periureteral stranding. In axial image 46 there is a calcified obstructive calculus in proximal left ureter measures 3.6 mm at the mid level of L4 vertebral body.  Bilateral distal ureter is unremarkable. No small bowel obstruction. No ascites or free air. No adenopathy. No pericecal inflammation. Normal retrocecal appendix. Again noted a probable fundal uterine fibroid measures about 2.5 cm mild thickening of urinary bladder wall. Bilateral distal ureter is unremarkable. There is no colitis  or diverticulitis. No distal colonic obstruction.  Mild decreased enhancement of the left kidney. There is delay excretion of the left kidney consistent with obstructive uropathy.  IMPRESSION: 1. There is mild left hydronephrosis and left proximal hydroureter. Mild decreased enhancement and excretion of the left kidney consistent with obstructive uropathy. There is small left perinephric fluid. Stranding left perinephric and proximal left periureteral. 2. There is a calcified obstructive calculus in left ureter measures 3.6 mm at the level of mid aspect of L4 vertebral body. 3. Bilateral distal ureter is unremarkable. 4. Again noted myometrial fundal fibroid within uterus. 5. Normal appendix.  No pericecal inflammation. 6. No colitis or diverticulitis.   Electronically Signed   By: LiLahoma Crocker.D.   On: 01/27/2015 12:08    EKG Interpretation None      MDM   Final diagnoses:  Left ureteral stone    [age]ear old female with left lower quadrant/left flank pain.  Symptoms and workup consistent with left ureteral stone. Afebrile. Renal function is okay. Urinalysis without signs of infection. Symptoms currently controlled. Plan continue symptomatic treatment.It has been determined that no acute conditions requiring further emergency intervention are present at this time. The patient has been advised of the diagnosis and plan. I reviewed any labs and imaging including any potential incidental findings. We have discussed signs and symptoms that warrant return to the ED and they are listed in the discharge instructions.      Virgel Manifold, MD 01/30/15 1105

## 2015-02-03 ENCOUNTER — Encounter (HOSPITAL_BASED_OUTPATIENT_CLINIC_OR_DEPARTMENT_OTHER): Payer: Self-pay | Admitting: *Deleted

## 2015-02-03 ENCOUNTER — Other Ambulatory Visit: Payer: Self-pay | Admitting: Urology

## 2015-02-04 ENCOUNTER — Encounter (HOSPITAL_BASED_OUTPATIENT_CLINIC_OR_DEPARTMENT_OTHER): Payer: Self-pay | Admitting: *Deleted

## 2015-02-04 NOTE — Progress Notes (Signed)
NPO AFTER MN. ARRIVE AT 1100. CURRENT LAB RESULTS IN CHART AND EPIC. MAY TAKE PAIN/ NAUSEA AM DOS W/ SIPS OF WATER.

## 2015-02-06 NOTE — H&P (Signed)
Reason For Visit Kidney stone   Active Problems Problems  1. 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 Wilson Singer (ER physician) for further evaluation of a kidney stone. She was seen in the [facility] on 01/27/15 for LLQ abdominal pain that was radiating to her back, associated with nausea/vomiting.( beginning 8AM, lasting for several hrs).     CT scan showed mild Lt hydronephrosis with Lt proximal hydroureter due to a 3.20mm Lt obstructing ureteral stone. NO sodas. Drinks water: 4 glasses/day. Fast foods: occasional only. Negative family hx for stones. No children.   Past Medical History Problems  1. History of Anxiety (F41.9) 2. History of depression (Z86.59) 3. History of lipoma (Z86.018) 4. History of multiple sclerosis (Z86.69)  Surgical History Problems  1. History of Lipectomy  Current Meds 1. Betaseron SOLR;  Therapy: (Recorded:01Apr2016) to Recorded 2. ClonazePAM 0.5 MG Oral Tablet;  Therapy: (Recorded:01Apr2016) to Recorded 3. Folic Acid TABS;  Therapy: (Recorded:01Apr2016) to Recorded 4. Lipitor TABS;  Therapy: (Recorded:01Apr2016) to Recorded 5. Ondansetron HCl - 4 MG Oral Tablet;  Therapy: (Recorded:01Apr2016) to Recorded 6. Oxycodone-Acetaminophen 5-325 MG Oral Tablet;  Therapy: (Recorded:01Apr2016) to Recorded 7. Vitamin E TABS;  Therapy: (Recorded:01Apr2016) to Recorded  Allergies Medication  1. No Known Drug Allergies  Family History Problems  1. Family history of cardiac disorder (Z82.49) : Father 2. Family history of diabetes mellitus (Z83.3) : Brother 3. Family history of malignant neoplasm (Z80.9) : Mother, Brother  Social History Problems    Alcohol use (Z78.9)   Denied: History of Caffeine use   Never a smoker   Occupation  Review of Systems Genitourinary, constitutional, skin, eye, otolaryngeal, hematologic/lymphatic, cardiovascular, pulmonary,  endocrine, musculoskeletal, gastrointestinal, neurological and psychiatric system(s) were reviewed and pertinent findings if present are noted and are otherwise negative.  Genitourinary: urinary frequency, urinary urgency, nocturia, hematuria and dyspareunia.  Gastrointestinal: nausea, vomiting and constipation.  Constitutional: feeling tired (fatigue).  Hematologic/Lymphatic: a tendency to easily bruise.  Musculoskeletal: back pain.  Psychiatric: depression and anxiety.    Vitals Vital Signs [Data Includes: Last 1 Day]  Recorded: 01Apr2016 12:03PM  Height: 5 ft 6 in Weight: 180 lb  BMI Calculated: 29.05 BSA Calculated: 1.91 Blood Pressure: 119 / 79 Temperature: 98.7 F Heart Rate: 68  Physical Exam Constitutional: Well nourished and well developed . No acute distress.  ENT:. The ears and nose are normal in appearance.  Neck: The appearance of the neck is normal and no neck mass is present.  Pulmonary: No respiratory distress and normal respiratory rhythm and effort.  Cardiovascular: Heart rate and rhythm are normal . No peripheral edema.  Abdomen: The abdomen is soft and nontender. No masses are palpated. No CVA tenderness. No hernias are palpable. No hepatosplenomegaly noted.  Lymphatics: The femoral and inguinal nodes are not enlarged or tender.  Skin: Normal skin turgor, no visible rash and no visible skin lesions.  Neuro/Psych:. Mood and affect are appropriate.    Results/Data  28 Jan 2015 11:28 AM  UA With REFLEX    COLOR YELLOW     APPEARANCE CLEAR     SPECIFIC GRAVITY 1.015     pH 6.5     GLUCOSE NEG     BILIRUBIN NEG     KETONE NEG     BLOOD LARGE     PROTEIN NEG     UROBILINOGEN 0.2     NITRITE NEG  LEUKOCYTE ESTERASE NEG     SQUAMOUS EPITHELIAL/HPF NONE SEEN     WBC NONE SEEN     CRYSTALS NONE SEEN     CASTS NONE SEEN     RBC 7-10     BACTERIA NONE SEEN     Assessment Assessed  1. Left ureteral stone (N20.1) 2. History of multiple sclerosis  (Z86.69)  Small L ureteral stone.   Plan Left ureteral stone  1. Start: Tamsulosin HCl - 0.4 MG Oral Capsule; TAKE 1 CAPSULE Bedtime 2. Changed: From  Oxycodone-Acetaminophen 5-325 MG Oral Tablet  To  Oxycodone-Acetaminophen 5-325 MG Oral Tablet TAKE 1 TABLET Every  4 hours  PRN pain 3. Administer: Ketorolac Tromethamine 60 MG/2ML Intramuscular Solution; INJECT 60  MG  Intramuscular 4. Follow-up NP/PA [facility] Office  Follow-up  Status: Hold For - Date of Service  Requested  for: 12Apr2016  Toradol and Flomax.   RTC Diane 1 week.CT f/u shows stone in bladder or ureteral orifice. She will strain urine for weekend, and may cancel surgery if she passes her stone    Discussion/Summary cc: Dr. Jordan Hawks Rankins   cc: Dr. Ala Bent, [facility]     Signatures Electronically signed by : Carolan Clines, M.D.; Jan 31 2015  9:34AM EST

## 2015-02-07 ENCOUNTER — Encounter (HOSPITAL_BASED_OUTPATIENT_CLINIC_OR_DEPARTMENT_OTHER): Payer: Self-pay | Admitting: Anesthesiology

## 2015-02-07 ENCOUNTER — Encounter (HOSPITAL_BASED_OUTPATIENT_CLINIC_OR_DEPARTMENT_OTHER)
Admission: RE | Disposition: A | Discharge status: Home / Self Care | Payer: Self-pay | Source: Ambulatory Visit | Attending: Urology

## 2015-02-07 ENCOUNTER — Ambulatory Visit (HOSPITAL_BASED_OUTPATIENT_CLINIC_OR_DEPARTMENT_OTHER)
Admission: RE | Admit: 2015-02-07 | Discharge: 2015-02-07 | Disposition: A | Discharge status: Home / Self Care | Payer: 59 | Source: Ambulatory Visit | Attending: Urology | Admitting: Urology

## 2015-02-07 DIAGNOSIS — Z79891 Long term (current) use of opiate analgesic: Secondary | ICD-10-CM | POA: Insufficient documentation

## 2015-02-07 DIAGNOSIS — F329 Major depressive disorder, single episode, unspecified: Secondary | ICD-10-CM | POA: Diagnosis not present

## 2015-02-07 DIAGNOSIS — N201 Calculus of ureter: Secondary | ICD-10-CM | POA: Insufficient documentation

## 2015-02-07 DIAGNOSIS — Z538 Procedure and treatment not carried out for other reasons: Secondary | ICD-10-CM | POA: Diagnosis not present

## 2015-02-07 DIAGNOSIS — Z79899 Other long term (current) drug therapy: Secondary | ICD-10-CM | POA: Insufficient documentation

## 2015-02-07 DIAGNOSIS — F419 Anxiety disorder, unspecified: Secondary | ICD-10-CM | POA: Diagnosis not present

## 2015-02-07 DIAGNOSIS — G35 Multiple sclerosis: Secondary | ICD-10-CM | POA: Insufficient documentation

## 2015-02-07 HISTORY — DX: Urgency of urination: R39.15

## 2015-02-07 HISTORY — DX: Personal history of adenomatous and serrated colon polyps: Z86.0101

## 2015-02-07 HISTORY — DX: Anxiety disorder, unspecified: F41.9

## 2015-02-07 HISTORY — DX: Personal history of other diseases of the female genital tract: Z87.42

## 2015-02-07 HISTORY — DX: Personal history of colonic polyps: Z86.010

## 2015-02-07 HISTORY — DX: Calculus of ureter: N20.1

## 2015-02-07 SURGERY — CYSTOURETEROSCOPY, WITH RETROGRADE PYELOGRAM AND STENT INSERTION
Anesthesia: General

## 2015-02-07 SURGICAL SUPPLY — 30 items
ADAPTER CATH URET PLST 4-6FR (CATHETERS) IMPLANT
BAG DRAIN URO-CYSTO SKYTR STRL (DRAIN) IMPLANT
BASKET LASER NITINOL 1.9FR (BASKET) IMPLANT
BASKET STNLS GEMINI 4WIRE 3FR (BASKET) IMPLANT
BASKET ZERO TIP NITINOL 2.4FR (BASKET) IMPLANT
BOOTIES KNEE HIGH SLOAN (MISCELLANEOUS) IMPLANT
CANISTER SUCT LVC 12 LTR MEDI- (MISCELLANEOUS) IMPLANT
CATH CLEAR GEL 3F BACKSTOP (CATHETERS) IMPLANT
CATH INTERMIT  6FR 70CM (CATHETERS) IMPLANT
CATH URET 5FR 28IN CONE TIP (BALLOONS)
CATH URET 5FR 28IN OPEN ENDED (CATHETERS) IMPLANT
CATH URET 5FR 70CM CONE TIP (BALLOONS) IMPLANT
CATH URET DUAL LUMEN 6-10FR 50 (CATHETERS) IMPLANT
CLOTH BEACON ORANGE TIMEOUT ST (SAFETY) IMPLANT
ELECT REM PT RETURN 9FT ADLT (ELECTROSURGICAL)
ELECTRODE REM PT RTRN 9FT ADLT (ELECTROSURGICAL) IMPLANT
GLOVE BIO SURGEON STRL SZ7.5 (GLOVE) IMPLANT
GOWN STRL REUS W/ TWL LRG LVL3 (GOWN DISPOSABLE) IMPLANT
GOWN STRL REUS W/ TWL XL LVL3 (GOWN DISPOSABLE) IMPLANT
GOWN STRL REUS W/TWL LRG LVL3 (GOWN DISPOSABLE)
GOWN STRL REUS W/TWL XL LVL3 (GOWN DISPOSABLE)
GUIDEWIRE 0.038 PTFE COATED (WIRE) IMPLANT
GUIDEWIRE ANG ZIPWIRE 038X150 (WIRE) IMPLANT
GUIDEWIRE STR DUAL SENSOR (WIRE) IMPLANT
IV NS IRRIG 3000ML ARTHROMATIC (IV SOLUTION) IMPLANT
KIT BALLIN UROMAX 15FX10 (LABEL) IMPLANT
KIT BALLN UROMAX 15FX4 (MISCELLANEOUS) IMPLANT
KIT BALLN UROMAX 26 75X4 (MISCELLANEOUS)
SET HIGH PRES BAL DIL (LABEL)
SHEATH ACCESS URETERAL 38CM (SHEATH) IMPLANT

## 2015-02-07 NOTE — Progress Notes (Signed)
Surgery canceled, passed stone at home. Dr. Gaynelle Arabian into see.

## 2015-02-07 NOTE — Progress Notes (Signed)
Urology Progress Note  Day of Surgery   Subjective: Pt passed stone Sunday morning, and brings stone with her thi AM. No maor pain. Surgery will be cancelled.     No acute urologic events overnight. Ambulation:   positive Flatus:    positive Bowel movement  positive  Pain: complete resolution  Objective:  Height 5\' 6"  (1.676 m), weight 71.668 kg (158 lb).  Physical Exam:  General:  No acute distress, awake  Genitourinary:  No flank pain.  Foley: none      No results for input(s): HGB, WBC, PLT in the last 72 hours.  No results for input(s): NA, K, CL, CO2, BUN, CREATININE, CALCIUM, GFRNONAA, GFRAA in the last 72 hours.  Invalid input(s): MAGNESIUM   No results for input(s): INR, APTT in the last 72 hours.  Invalid input(s): PT   Invalid input(s): ABG  Assessment/Plan:  Pt is to increase activities as tolerated. Stone will be sent for analysis

## 2015-02-07 NOTE — Interval H&P Note (Signed)
History and Physical Interval Note:  02/07/2015 10:53 AM  Barbara Ellison  has presented today for surgery, with the diagnosis of left ureteral stone  The various methods of treatment have been discussed with the patient and family. After consideration of risks, benefits and other options for treatment, the patient has consented to  Procedure(s): CYSTOSCOPY WITH  LEFT RETROGRADE PYELOGRAM, URETEROSCOPY, STONE EXTRACTION AND POSSIBLE DOUBLE J STENT PLACEMENT (Left) HOLMIUM LASER APPLICATION (N/A) as a surgical intervention .  The patient's history has been reviewed, patient examined, no change in status, stable for surgery.  I have reviewed the patient's chart and labs.  Questions were answered to the patient's satisfaction.     [name] I [name] Pt presents with passed stone-passed Sunday, with no more flank pain. Stone is correnct size. We will cancel case and send stone for analysis.

## 2015-02-07 NOTE — Anesthesia Preprocedure Evaluation (Deleted)
Anesthesia Evaluation  Patient identified by MRN, date of birth, ID band Patient awake    Reviewed: Allergy & Precautions, H&P , NPO status , Patient's Chart, lab work & pertinent test results  Airway Mallampati: II  TM Distance: >3 FB Neck ROM: full    Dental no notable dental hx.    Pulmonary neg pulmonary ROS,  breath sounds clear to auscultation  Pulmonary exam normal       Cardiovascular Exercise Tolerance: Good negative cardio ROS  Rhythm:regular Rate:Normal  Anterior fascicular block   Neuro/Psych Multiple sclerosis negative neurological ROS  negative psych ROS   GI/Hepatic negative GI ROS, Neg liver ROS,   Endo/Other  negative endocrine ROS  Renal/GU negative Renal ROS  negative genitourinary   Musculoskeletal   Abdominal   Peds  Hematology negative hematology ROS (+)   Anesthesia Other Findings   Reproductive/Obstetrics negative OB ROS                             Anesthesia Physical Anesthesia Plan  ASA: III  Anesthesia Plan: General   Post-op Pain Management:    Induction: Intravenous  Airway Management Planned: LMA  Additional Equipment:   Intra-op Plan:   Post-operative Plan:   Informed Consent: I have reviewed the patients History and Physical, chart, labs and discussed the procedure including the risks, benefits and alternatives for the proposed anesthesia with the patient or authorized representative who has indicated his/her understanding and acceptance.   Dental Advisory Given  Plan Discussed with: CRNA and Surgeon  Anesthesia Plan Comments:         Anesthesia Quick Evaluation

## 2015-03-28 ENCOUNTER — Encounter (HOSPITAL_COMMUNITY): Payer: Self-pay | Admitting: Emergency Medicine

## 2015-03-28 ENCOUNTER — Emergency Department (HOSPITAL_COMMUNITY)
Admission: EM | Admit: 2015-03-28 | Discharge: 2015-03-28 | Disposition: A | Discharge status: Home / Self Care | Payer: 59 | Source: Home / Self Care | Attending: Family Medicine | Admitting: Family Medicine

## 2015-03-28 ENCOUNTER — Emergency Department (INDEPENDENT_AMBULATORY_CARE_PROVIDER_SITE_OTHER): Payer: 59

## 2015-03-28 DIAGNOSIS — W19XXXA Unspecified fall, initial encounter: Secondary | ICD-10-CM | POA: Diagnosis not present

## 2015-03-28 DIAGNOSIS — S92301A Fracture of unspecified metatarsal bone(s), right foot, initial encounter for closed fracture: Secondary | ICD-10-CM

## 2015-03-28 NOTE — ED Provider Notes (Signed)
Barbara Ellison is a 59 y.o. female who presents to Urgent Care today for right knee ankle and foot pain. Patient tripped and fell in a store yesterday landing on her right knee and suffered an inversion injury to her right ankle and foot. She notes considerable pain with ambulation associated with swelling. She has tried Aleve which helps. No radiating pain weakness or numbness.   Past Medical History  Diagnosis Date  . Multiple sclerosis dx 2001 (approx.) relapsing-remitting    dr Raquel Sarna pharr--  [facility]  . History of abnormal cervical Pap smear   . Left ureteral stone   . History of adenomatous polyp of colon     2005 & 2010  . Urgency of urination   . Anxiety    Past Surgical History  Procedure Laterality Date  . Excision soft tissue mass, right chest wall  02-26-2013  . Colonoscopy  last one 2015  . Transthoracic echocardiogram  12-24-2012    Ventricular septum motion showed paradox,  ef 55-60%,   mild MR,  trivial PR and TR   History  Substance Use Topics  . Smoking status: Never Smoker   . Smokeless tobacco: Never Used  . Alcohol Use: Yes     Comment: occasional glass of wine   ROS as above Medications: No current facility-administered medications for this encounter.   Current Outpatient Prescriptions  Medication Sig Dispense Refill  . atorvastatin (LIPITOR) 10 MG tablet Take 10 mg by mouth every morning.     . clonazePAM (KLONOPIN) 0.5 MG tablet Take 0.5 mg by mouth 3 (three) times daily as needed for anxiety (anxiety).     . folic acid (FOLVITE) 161 MCG tablet Take 400 mcg by mouth daily.    . Interferon Beta-1b (BETASERON) 0.3 MG KIT injection Inject 0.25 mg into the skin every other day. Qod  In the pm    . magnesium hydroxide (MILK OF MAGNESIA) 400 MG/5ML suspension Take by mouth daily as needed for mild constipation.    . Multiple Vitamins-Minerals (WOMENS ONE DAILY PO) Take 1 tablet by mouth daily.    . ondansetron (ZOFRAN) 4 MG tablet Take 1 tablet (4 mg total) by  mouth every 6 (six) hours. 12 tablet 0  . oxybutynin (DITROPAN) 5 MG tablet Take 5 mg by mouth 3 (three) times daily as needed.    Marland Kitchen oxyCODONE-acetaminophen (PERCOCET/ROXICET) 5-325 MG per tablet Take 1-2 tablets by mouth every 4 (four) hours as needed for severe pain. 15 tablet 0  . Polyethylene Glycol 3350 (MIRALAX PO) Take by mouth as needed.    . tamsulosin (FLOMAX) 0.4 MG CAPS capsule Take 0.4 mg by mouth daily after supper.    . vitamin E 400 UNIT capsule Take 400 Units by mouth daily.    Marland Kitchen zolpidem (AMBIEN CR) 12.5 MG CR tablet Take 12.5 mg by mouth at bedtime as needed for sleep (sleep).      No Known Allergies   Exam:  BP 137/87 mmHg  Pulse 65  Temp(Src) 97.2 F (36.2 C) (Oral)  Resp 16  SpO2 100% Gen: Well NAD HEENT: EOMI,  MMM Lungs: Normal work of breathing. CTABL Heart: RRR no MRG Abd: NABS, Soft. Nondistended, Nontender Exts: Brisk capillary refill, warm and well perfused.  Right leg knee: Swollen tender anterior medial knee. Normal range of motion stable ligamentous exam Ankle and foot swollen and tender lateral foot and ankle. Stable ligamentous exam. Pulses capillary refill and sensation intact.  No results found for this or any previous  visit (from the past 24 hour(s)). Dg Ankle Complete Right  03/28/2015   CLINICAL DATA:  Fall, pain/ swelling 5th metatarsal  EXAM: RIGHT ANKLE - COMPLETE 3+ VIEW  COMPARISON:  None.  FINDINGS: Fracture involving the base of the 5th metatarsal, better visualized on dedicated foot radiographs.  No evidence of fracture dislocation of the ankle.  The ankle mortise is intact.  Mild lateral soft tissue swelling.  IMPRESSION: Fracture involving the base of the 5th metatarsal, better visualized on dedicated foot radiographs.  Mild lateral soft tissue swelling.   Electronically Signed   By: Julian Hy M.D.   On: 03/28/2015 16:39   Dg Knee Complete 4 Views Right  03/28/2015   CLINICAL DATA:  Fall from standing height with right knee  injury and medial pain.  EXAM: RIGHT KNEE - COMPLETE 4+ VIEW  COMPARISON:  None.  FINDINGS: Examination demonstrates mild tricompartmental osteoarthritic change. There is no acute fracture or dislocation. No significant joint effusion.  IMPRESSION: No acute findings.   Electronically Signed   By: Marin Olp M.D.   On: 03/28/2015 16:38   Dg Foot Complete Right  03/28/2015   CLINICAL DATA:  Fall from standing height with fifth metatarsal pain and swelling.  EXAM: RIGHT FOOT COMPLETE - 3+ VIEW  COMPARISON:  None.  FINDINGS: Mild degenerative change over the first MTP joint. Minimal degenerative change over the midfoot. Minimally displaced transverse fracture through the base of the fifth metatarsal.  IMPRESSION: Minimally displaced transverse fracture at the base of the fifth metatarsal.   Electronically Signed   By: Marin Olp M.D.   On: 03/28/2015 16:40    Assessment and Plan: 59 y.o. female with fifth metatarsal fracture or avulsion type. Doubtful for Jones type fracture. Patient was placed into a cam walker brace and will follow-up with orthopedics this week.  Discussed warning signs or symptoms. Please see discharge instructions. Patient expresses understanding.     Gregor Hams, MD 03/28/15 [id number]

## 2015-03-28 NOTE — ED Notes (Signed)
C/o right knee and foot pain due to falling yesterday inside of clothing store States she tripped and fell Painful to walk on foot  no tx tried

## 2015-03-28 NOTE — Discharge Instructions (Signed)
Thank you for coming in today. Take Aleve as needed. Use the cam walker. Follow-up with Dr. Mardelle Matte at [place] Do not drive while wearing the cam walker boot.   Metatarsal Fracture   A metatarsal fracture is a break (fracture) of one of the bones of the mid-foot (metatarsal bones). The metatarsal bones are responsible for maintaining the arch of the foot. There are three classifications of metatarsal fractures: dancer's fractures, Jones fractures, and stress fractures. A dancer's fracture is when a piece of bone is pulled off by a ligament or tendon (avulsion fracture) of the outer part of the foot (fifth metatarsal), near the joint with the ankle bones. A Jones fracture occurs in the middle of the fifth metatarsal. These fractures have limited ability to heal. A stress fracture occurs when the bone is slowly injured faster than it can repair itself. SYMPTOMS   Sharp pain, especially with standing or walking.  Tenderness, swelling, and later bruising (contusion) of the foot.  Numbness or paralysis from swelling in the foot, causing pressure on the blood vessels or nerves (uncommon). CAUSES  Fractures occur when a force is placed on the bone that is greater than it can handle. Common causes of injury include:  Direct hit (trauma) to the foot.  Twisting injury to the foot or ankle.  Landing on the foot and ankle in an improper position. RISK INCRESES WITH:  Participation in contact sports, sports that require jumping and landing, or sports in which cleats are worn and sliding occurs.  Previous foot or ankle sprains or dislocations.  Repeated injury to any joint in the foot.  Poor strength and flexibility. PREVENTION  Warm up and stretch properly before an activity.  Allow for adequate recovery between workouts.  Maintain physical fitness in:  Strength, flexibility, and endurance.  Cardiovascular fitness.  When participating in jumping or contact sports,  protect joints with supportive devices, such as wrapped elastic bandages, tape, braces, or high-top athletic shoes.  Wear properly fitted and padded protective equipment. PROGNOSIS If treated properly, metatarsal fractures usually heal well. Jones fractures have a higher risk of the bone failing to heal (nonunion). Sometimes, surgery is needed to heal Jones fractures.  RELATED COMPLICATIONS   Nonunion.  Fracture heals in a poor position (malunion).  Long-term (chronic) pain, stiffness, or swelling of the foot.  Excessive bleeding in the foot or at the dislocation site, causing pressure and injury to nerves and blood vessels (rare).  Unstable or arthritic joint following repeated injury or delayed treatment. TREATMENT  Treatment first involves the use of ice and medicine, to reduce pain and inflammation. If the bone fragments are out of alignment (displaced), then immediate realigning of the bones (reduction) is required. Fractures that cannot be realigned by hand, or where the bones protrude through the skin (open), may require surgery to hold the fracture in place with screws, pins, and plates. After the bones are in proper alignment, the foot and ankle must be restrained for 6 or more weeks. Restraint allows healing to occur. After restraint, it is important to perform strengthening and stretching exercises to help regain strength and a full range of motion. These exercises may be completed at home or with a therapist. A stiff-soled shoe and arch support (orthotic) may be required when first returning to sports. MEDICATION   If pain medicine is needed, nonsteroidal anti-inflammatory medicines (NSAIDS), or other minor pain relievers, are often advised.  Do not take pain medicine for 7 days before surgery.  Only  take over-the-counter or prescription medicines for pain, fever, or discomfort as directed by your caregiver. COLD THERAPY  Cold treatment (icing) should be applied for 10 to 15  minutes every 2 to 3 hours for inflammations and pain, and immediately after activity that aggravates your symptoms. Use ice packs or ice massage. SEEK MEDICAL CARE IF:  Pain, tenderness, or swelling gets worse, despite treatment.  You experience pain, numbness, or coldness in the foot.  Blue, gray, or dark color appears in the toenails.  You or your child has an oral temperature above 102 F (38.9 C).  You have increased pain, swelling, and redness.  You have drainage of fluids or bleeding in the affected area.  New, unexplained symptoms develop. (Drugs used in treatment may produce side effects.)

## 2015-04-06 ENCOUNTER — Encounter (HOSPITAL_BASED_OUTPATIENT_CLINIC_OR_DEPARTMENT_OTHER): Payer: Self-pay | Admitting: *Deleted

## 2015-04-08 ENCOUNTER — Encounter (HOSPITAL_BASED_OUTPATIENT_CLINIC_OR_DEPARTMENT_OTHER)
Admission: RE | Disposition: A | Discharge status: Home / Self Care | Payer: Self-pay | Source: Ambulatory Visit | Attending: Orthopedic Surgery

## 2015-04-08 ENCOUNTER — Encounter (HOSPITAL_BASED_OUTPATIENT_CLINIC_OR_DEPARTMENT_OTHER): Payer: Self-pay

## 2015-04-08 ENCOUNTER — Ambulatory Visit (HOSPITAL_BASED_OUTPATIENT_CLINIC_OR_DEPARTMENT_OTHER): Payer: 59 | Admitting: Certified Registered"

## 2015-04-08 ENCOUNTER — Ambulatory Visit (HOSPITAL_BASED_OUTPATIENT_CLINIC_OR_DEPARTMENT_OTHER)
Admission: RE | Admit: 2015-04-08 | Discharge: 2015-04-08 | Disposition: A | Discharge status: Home / Self Care | Payer: 59 | Source: Ambulatory Visit | Attending: Orthopedic Surgery | Admitting: Orthopedic Surgery

## 2015-04-08 DIAGNOSIS — Y939 Activity, unspecified: Secondary | ICD-10-CM | POA: Diagnosis not present

## 2015-04-08 DIAGNOSIS — Y999 Unspecified external cause status: Secondary | ICD-10-CM | POA: Diagnosis not present

## 2015-04-08 DIAGNOSIS — Z79891 Long term (current) use of opiate analgesic: Secondary | ICD-10-CM | POA: Insufficient documentation

## 2015-04-08 DIAGNOSIS — Y929 Unspecified place or not applicable: Secondary | ICD-10-CM | POA: Diagnosis not present

## 2015-04-08 DIAGNOSIS — G35 Multiple sclerosis: Secondary | ICD-10-CM | POA: Insufficient documentation

## 2015-04-08 DIAGNOSIS — Z79899 Other long term (current) drug therapy: Secondary | ICD-10-CM | POA: Insufficient documentation

## 2015-04-08 DIAGNOSIS — X58XXXA Exposure to other specified factors, initial encounter: Secondary | ICD-10-CM | POA: Insufficient documentation

## 2015-04-08 DIAGNOSIS — S92351A Displaced fracture of fifth metatarsal bone, right foot, initial encounter for closed fracture: Secondary | ICD-10-CM | POA: Diagnosis present

## 2015-04-08 DIAGNOSIS — F419 Anxiety disorder, unspecified: Secondary | ICD-10-CM | POA: Diagnosis not present

## 2015-04-08 HISTORY — DX: Displaced fracture of fifth metatarsal bone, right foot, initial encounter for closed fracture: S92.351A

## 2015-04-08 HISTORY — PX: ORIF TOE FRACTURE: SHX5032

## 2015-04-08 HISTORY — DX: Myoneural disorder, unspecified: G70.9

## 2015-04-08 LAB — POCT HEMOGLOBIN-HEMACUE: Hemoglobin: 13.2 g/dL (ref 12.0–15.0)

## 2015-04-08 SURGERY — OPEN REDUCTION INTERNAL FIXATION (ORIF) METATARSAL (TOE) FRACTURE
Anesthesia: Monitor Anesthesia Care | Site: Foot | Laterality: Right

## 2015-04-08 MED ORDER — FENTANYL CITRATE (PF) 100 MCG/2ML IJ SOLN
INTRAMUSCULAR | Status: AC
  Filled 2015-04-08: qty 2

## 2015-04-08 MED ORDER — ONDANSETRON HCL 4 MG PO TABS: 4.0000 mg | ORAL_TABLET | Freq: Four times a day (QID) | ORAL | Status: DC

## 2015-04-08 MED ORDER — PROPOFOL INFUSION 10 MG/ML OPTIME
INTRAVENOUS | Status: DC | PRN
  Administered 2015-04-08: 75 ug/kg/min via INTRAVENOUS

## 2015-04-08 MED ORDER — LIDOCAINE-EPINEPHRINE (PF) 1.5 %-1:200000 IJ SOLN
INTRAMUSCULAR | Status: DC | PRN
  Administered 2015-04-08: 30 mL via PERINEURAL

## 2015-04-08 MED ORDER — CEFAZOLIN SODIUM-DEXTROSE 2-3 GM-% IV SOLR
INTRAVENOUS | Status: AC
  Filled 2015-04-08: qty 50

## 2015-04-08 MED ORDER — LACTATED RINGERS IV SOLN
INTRAVENOUS | Status: DC
Start: 2015-04-08 — End: 2015-04-08
  Administered 2015-04-08: 11:00:00 via INTRAVENOUS

## 2015-04-08 MED ORDER — BUPIVACAINE-EPINEPHRINE (PF) 0.5% -1:200000 IJ SOLN
INTRAMUSCULAR | Status: DC | PRN
  Administered 2015-04-08: 30 mL via PERINEURAL

## 2015-04-08 MED ORDER — BUPIVACAINE HCL (PF) 0.25 % IJ SOLN
INTRAMUSCULAR | Status: AC
  Filled 2015-04-08: qty 30

## 2015-04-08 MED ORDER — MIDAZOLAM HCL 2 MG/2ML IJ SOLN
1.0000 mg | INTRAMUSCULAR | Status: DC | PRN
Start: 2015-04-08 — End: 2015-04-08
  Administered 2015-04-08: 2 mg via INTRAVENOUS
  Administered 2015-04-08: 1 mg via INTRAVENOUS

## 2015-04-08 MED ORDER — MIDAZOLAM HCL 2 MG/2ML IJ SOLN
INTRAMUSCULAR | Status: AC
  Filled 2015-04-08: qty 2

## 2015-04-08 MED ORDER — CEFAZOLIN SODIUM-DEXTROSE 2-3 GM-% IV SOLR: 2.0000 g | INTRAVENOUS | Status: DC

## 2015-04-08 MED ORDER — BUPIVACAINE HCL (PF) 0.5 % IJ SOLN
INTRAMUSCULAR | Status: AC
  Filled 2015-04-08: qty 30

## 2015-04-08 MED ORDER — FENTANYL CITRATE (PF) 100 MCG/2ML IJ SOLN
50.0000 ug | INTRAMUSCULAR | Status: DC | PRN
  Administered 2015-04-08: 100 ug via INTRAVENOUS

## 2015-04-08 MED ORDER — CEFAZOLIN SODIUM-DEXTROSE 2-3 GM-% IV SOLR
2.0000 g | INTRAVENOUS | Status: AC
  Administered 2015-04-08: 2 g via INTRAVENOUS

## 2015-04-08 MED ORDER — OXYCODONE-ACETAMINOPHEN 10-325 MG PO TABS: 1.0000 | ORAL_TABLET | Freq: Four times a day (QID) | ORAL | Status: DC | PRN

## 2015-04-08 MED ORDER — ONDANSETRON HCL 4 MG/2ML IJ SOLN
INTRAMUSCULAR | Status: DC | PRN
  Administered 2015-04-08: 4 mg via INTRAVENOUS

## 2015-04-08 MED ORDER — FENTANYL CITRATE (PF) 100 MCG/2ML IJ SOLN
INTRAMUSCULAR | Status: AC
  Filled 2015-04-08: qty 6

## 2015-04-08 MED ORDER — SENNA-DOCUSATE SODIUM 8.6-50 MG PO TABS: 2.0000 | ORAL_TABLET | Freq: Every day | ORAL | Status: DC

## 2015-04-08 MED ORDER — LIDOCAINE HCL (CARDIAC) 20 MG/ML IV SOLN
INTRAVENOUS | Status: DC | PRN
  Administered 2015-04-08: 30 mg via INTRAVENOUS

## 2015-04-08 SURGICAL SUPPLY — 69 items
BANDAGE ELASTIC 4 VELCRO ST LF (GAUZE/BANDAGES/DRESSINGS) ×4 IMPLANT
BANDAGE ELASTIC 6 VELCRO ST LF (GAUZE/BANDAGES/DRESSINGS) IMPLANT
BANDAGE ESMARK 6X9 LF (GAUZE/BANDAGES/DRESSINGS) IMPLANT
BIT DRILL 2.0 HCS 150 (BIT) ×2 IMPLANT
BLADE SURG 15 STRL LF DISP TIS (BLADE) ×2 IMPLANT
BLADE SURG 15 STRL SS (BLADE) ×2
BNDG ESMARK 4X9 LF (GAUZE/BANDAGES/DRESSINGS) ×2 IMPLANT
BNDG ESMARK 6X9 LF (GAUZE/BANDAGES/DRESSINGS)
CANISTER SUCT 1200ML W/VALVE (MISCELLANEOUS) IMPLANT
CLSR STERI-STRIP ANTIMIC 1/2X4 (GAUZE/BANDAGES/DRESSINGS) ×2 IMPLANT
CUFF TOURNIQUET SINGLE 24IN (TOURNIQUET CUFF) ×2 IMPLANT
CUFF TOURNIQUET SINGLE 34IN LL (TOURNIQUET CUFF) IMPLANT
DECANTER SPIKE VIAL GLASS SM (MISCELLANEOUS) IMPLANT
DRAPE EXTREMITY T 121X128X90 (DRAPE) ×2 IMPLANT
DRAPE OEC MINIVIEW 54X84 (DRAPES) ×2 IMPLANT
DRAPE U-SHAPE 47X51 STRL (DRAPES) ×2 IMPLANT
DURAPREP 26ML APPLICATOR (WOUND CARE) ×2 IMPLANT
ELECT REM PT RETURN 9FT ADLT (ELECTROSURGICAL) ×2
ELECTRODE REM PT RTRN 9FT ADLT (ELECTROSURGICAL) ×1 IMPLANT
GAUZE SPONGE 4X4 12PLY STRL (GAUZE/BANDAGES/DRESSINGS) ×2 IMPLANT
GAUZE XEROFORM 1X8 LF (GAUZE/BANDAGES/DRESSINGS) ×2 IMPLANT
GLOVE BIOGEL PI IND STRL 7.0 (GLOVE) ×1 IMPLANT
GLOVE BIOGEL PI IND STRL 8 (GLOVE) ×1 IMPLANT
GLOVE BIOGEL PI INDICATOR 7.0 (GLOVE) ×1
GLOVE BIOGEL PI INDICATOR 8 (GLOVE) ×1
GLOVE ECLIPSE 6.5 STRL STRAW (GLOVE) ×2 IMPLANT
GLOVE ORTHO TXT STRL SZ7.5 (GLOVE) ×2 IMPLANT
GLOVE SURG ORTHO 8.0 STRL STRW (GLOVE) ×2 IMPLANT
GOWN STRL REUS W/ TWL LRG LVL3 (GOWN DISPOSABLE) ×1 IMPLANT
GOWN STRL REUS W/ TWL XL LVL3 (GOWN DISPOSABLE) ×1 IMPLANT
GOWN STRL REUS W/TWL LRG LVL3 (GOWN DISPOSABLE) ×1
GOWN STRL REUS W/TWL XL LVL3 (GOWN DISPOSABLE) ×1
K-WIRE NON THREAD 1.1 (WIRE) ×2
KWIRE NON THREAD 1.1 (WIRE) ×1 IMPLANT
NEEDLE KEITH (NEEDLE) IMPLANT
NS IRRIG 1000ML POUR BTL (IV SOLUTION) ×2 IMPLANT
PACK ARTHROSCOPY DSU (CUSTOM PROCEDURE TRAY) ×2 IMPLANT
PACK BASIN DAY SURGERY FS (CUSTOM PROCEDURE TRAY) ×2 IMPLANT
PAD CAST 4YDX4 CTTN HI CHSV (CAST SUPPLIES) IMPLANT
PADDING CAST ABS 4INX4YD NS (CAST SUPPLIES) ×1
PADDING CAST ABS COTTON 4X4 ST (CAST SUPPLIES) ×1 IMPLANT
PADDING CAST COTTON 4X4 STRL (CAST SUPPLIES)
PENCIL BUTTON HOLSTER BLD 10FT (ELECTRODE) ×2 IMPLANT
SCREW COMPRESSION 3.0X24MM (Screw) ×2 IMPLANT
SLEEVE SCD COMPRESS KNEE MED (MISCELLANEOUS) ×2 IMPLANT
SPLINT FAST PLASTER 5X30 (CAST SUPPLIES) ×10
SPLINT PLASTER CAST FAST 5X30 (CAST SUPPLIES) ×10 IMPLANT
SPONGE LAP 4X18 X RAY DECT (DISPOSABLE) IMPLANT
STAPLER VISISTAT 35W (STAPLE) IMPLANT
SUCTION FRAZIER TIP 10 FR DISP (SUCTIONS) IMPLANT
SUT ETHILON 3 0 PS 1 (SUTURE) ×2 IMPLANT
SUT ETHILON 4 0 PS 2 18 (SUTURE) IMPLANT
SUT FIBERWIRE #2 38 T-5 BLUE (SUTURE)
SUT FIBERWIRE #5 38 CONV NDL (SUTURE)
SUT MNCRL AB 4-0 PS2 18 (SUTURE) IMPLANT
SUT VIC AB 0 CT1 27 (SUTURE)
SUT VIC AB 0 CT1 27XBRD ANBCTR (SUTURE) IMPLANT
SUT VIC AB 2-0 SH 18 (SUTURE) IMPLANT
SUT VIC AB 2-0 SH 27 (SUTURE)
SUT VIC AB 2-0 SH 27XBRD (SUTURE) IMPLANT
SUT VIC AB 3-0 SH 27 (SUTURE)
SUT VIC AB 3-0 SH 27X BRD (SUTURE) IMPLANT
SUT VICRYL 3-0 CR8 SH (SUTURE) IMPLANT
SUT VICRYL 4-0 PS2 18IN ABS (SUTURE) IMPLANT
SUTURE FIBERWR #2 38 T-5 BLUE (SUTURE) IMPLANT
SUTURE FIBERWR #5 38 CONV NDL (SUTURE) IMPLANT
SYR BULB 3OZ (MISCELLANEOUS) ×2 IMPLANT
UNDERPAD 30X30 (UNDERPADS AND DIAPERS) ×2 IMPLANT
YANKAUER SUCT BULB TIP NO VENT (SUCTIONS) IMPLANT

## 2015-04-08 NOTE — Transfer of Care (Signed)
Immediate Anesthesia Transfer of Care Note  Patient: Barbara Ellison  Procedure(s) Performed: Procedure(s): OPEN REDUCTION INTERNAL FIXATION (ORIF) RIGHT METATARSAL (TOE) FRACTURE (Right)  Patient Location: PACU  Anesthesia Type:MAC combined with regional for post-op pain  Level of Consciousness: awake, alert  and patient cooperative  Airway & Oxygen Therapy: Patient Spontanous Breathing and Patient connected to face mask oxygen  Post-op Assessment: Report given to RN, Post -op Vital signs reviewed and stable and Patient moving all extremities  Post vital signs: Reviewed and stable  Last Vitals:  Filed Vitals:   04/08/15 1245  BP:   Pulse:   Temp:   Resp: 19    Complications: No apparent anesthesia complications

## 2015-04-08 NOTE — Anesthesia Procedure Notes (Addendum)
Anesthesia Regional Block:  Popliteal block  Pre-Anesthetic Checklist: ,, timeout performed, Correct Patient, Correct Site, Correct Laterality, Correct Procedure, Correct Position, site marked, Risks and benefits discussed,  Surgical consent,  Pre-op evaluation,  At surgeon's request and post-op pain management  Laterality: Right  Prep: chloraprep       Needles:  Injection technique: Single-shot  Needle Type: Echogenic Stimulator Needle     Needle Length: 10cm 10 cm Needle Gauge: 21 and 21 G    Additional Needles:  Procedures: ultrasound guided (picture in chart) and nerve stimulator Popliteal block  Nerve Stimulator or Paresthesia:  Response: 0.4 mA,   Additional Responses:   Narrative:  Start time: 04/08/2015 11:35 AM End time: 04/08/2015 11:50 AM Injection made incrementally with aspirations every 5 mL.  Performed by: Personally  Anesthesiologist: Lillia Abed  Additional Notes: Monitors applied. Patient sedated. Sterile prep and drape,hand hygiene and sterile gloves were used. Relevant anatomy identified.Needle position confirmed.Local anesthetic injected incrementally after negative aspiration. Local anesthetic spread visualized around nerve(s). Vascular puncture avoided. No complications. Image printed for medical record.The patient tolerated the procedure well.  Additional Saphenous nerve block performed. 15cc Local Anesthetic mixture placed under ultrasonic guidance along the medio-inferior border of the Sartorious muscle 6 inches above the knee.  No Problems encountered.  Lillia Abed MD    Procedure Name: [facility] Date/Time: 04/08/2015 1:06 PM Performed by: Camelle Henkels D Pre-anesthesia Checklist: Patient identified, Emergency Drugs available, Suction available, Patient being monitored and Timeout performed Patient Re-evaluated:Patient Re-evaluated prior to inductionOxygen Delivery Method: Simple face mask

## 2015-04-08 NOTE — Anesthesia Postprocedure Evaluation (Signed)
Anesthesia Post Note  Patient: Barbara Ellison  Procedure(s) Performed: Procedure(s) (LRB): OPEN REDUCTION INTERNAL FIXATION (ORIF) RIGHT METATARSAL (TOE) FRACTURE (Right)  Anesthesia type: regional  Patient location: PACU  Post pain: Pain level controlled  Post assessment: Patient's Cardiovascular Status Stable  Last Vitals:  Filed Vitals:   04/08/15 1415  BP: 120/76  Pulse: 81  Temp:   Resp: 14    Post vital signs: Reviewed and stable  Level of consciousness: sedated  Complications: No apparent anesthesia complications

## 2015-04-08 NOTE — Progress Notes (Signed)
Assisted Dr. Ossey with right, ultrasound guided, popliteal/saphenous block. Side rails up, monitors on throughout procedure. See vital signs in flow sheet. Tolerated Procedure well. 

## 2015-04-08 NOTE — Anesthesia Preprocedure Evaluation (Addendum)
Anesthesia Evaluation  Patient identified by MRN, date of birth, ID band Patient awake    Reviewed: Allergy & Precautions, NPO status , Patient's Chart, lab work & pertinent test results  Airway Mallampati: I  TM Distance: >3 FB Neck ROM: Full    Dental   Pulmonary    Pulmonary exam normal       Cardiovascular Normal cardiovascular exam 2/14 echo EF 55-60, mild MR   Neuro/Psych PSYCHIATRIC DISORDERS Anxiety    GI/Hepatic   Endo/Other    Renal/GU      Musculoskeletal   Abdominal   Peds  Hematology   Anesthesia Other Findings   Reproductive/Obstetrics                            Anesthesia Physical Anesthesia Plan  ASA: II  Anesthesia Plan: Regional   Post-op Pain Management:    Induction: Intravenous  Airway Management Planned:   Additional Equipment:   Intra-op Plan:   Post-operative Plan:   Informed Consent: I have reviewed the patients History and Physical, chart, labs and discussed the procedure including the risks, benefits and alternatives for the proposed anesthesia with the patient or authorized representative who has indicated his/her understanding and acceptance.     Plan Discussed with: CRNA and Surgeon  Anesthesia Plan Comments:        Anesthesia Quick Evaluation

## 2015-04-08 NOTE — Op Note (Signed)
04/08/2015  2:09 PM  PATIENT:  Barbara Ellison    PRE-OPERATIVE DIAGNOSIS:  Right fifth metatarsal base fracture, closed, displaced, acute  POST-OPERATIVE DIAGNOSIS:  Same  PROCEDURE:  Open reduction internal fixation right fifth metatarsal fracture  SURGEON:  Johnny Bridge, MD  PHYSICIAN ASSISTANT: None  ANESTHESIA:   General  PREOPERATIVE INDICATIONS:  Barbara Ellison is a  59 y.o. female who broke her right fifth metatarsal and initially was treated with closed management, although subsequently developed some displacement and elected for surgical management. We discussed the options of ongoing nonsurgical management, however concern about malunion and nonunion were present particularly given the interval displacement compared from the first injury film until one week subsequent.  The risks benefits and alternatives were discussed with the patient including but not limited to the risks of nonoperative treatment, versus surgical intervention including infection, bleeding, nerve injury, malunion, nonunion, the need for revision surgery, hardware prominence, hardware failure, the need for hardware removal, blood clots, cardiopulmonary complications, morbidity, mortality, among others, and they were willing to proceed.     OPERATIVE IMPLANTS: Synthes headless compression screw, size 3.0 mm x 25 mm with long threads  OPERATIVE FINDINGS: Displaced fifth metatarsal base fracture  OPERATIVE PROCEDURE: The patient was brought to the operating room and placed in the supine position. Regional anesthesia had been administered. Monitored anesthesia care provided. The right lower extremity was prepped and draped in usual sterile fashion. Time out performed. The leg was elevated and exsanguinated and the tourniquet was inflated. Vision was made over the lateral border of the fifth metatarsal and blunt dissection was carried down to the fracture site in order to preserve and protect any superficial  nerves. The fracture site was exposed, cleaned, and then reduced with a clamp. It was then held provisionally, and a K wire was placed. Length measured, and a compression screw selected. I drilled over the near cortex, and then placed the screw and applied compression achieving near anatomic reduction. The articular surface was nearly perfect based on radiographic imaging. I had bicortical purchase. I countersunk the screw slightly, such that it was basically exactly flush with the bone. I removed the instruments, took final C-arm pictures, irrigated the wounds copiously, and repaired the skin with nylon suture. A posterior splint was applied. She was awakened and returned to the PACU in stable and satisfactory condition. There were no complications and she tolerated the procedure well.

## 2015-04-08 NOTE — H&P (Signed)
PREOPERATIVE H&P  Chief Complaint: Right foot fracture  HPI: Barbara Ellison is a 59 y.o. female who presents for preoperative history and physical with a diagnosis of right fifth metatarsal base fracture Symptoms are rated as moderate to severe, and have been worsening.  This is significantly impairing activities of daily living.  She has elected for surgical management. She initially had mild displacement, which progressed over the first week. She now has a more displaced fracture, and has elected for surgical management. She has moderate pain over that location, positive swelling, difficulty bearing weight, worse with activities and better with rest.  Past Medical History  Diagnosis Date  . Multiple sclerosis dx 2001 (approx.) relapsing-remitting    dr Raquel Sarna pharr--  [facility]  . History of abnormal cervical Pap smear   . Left ureteral stone   . History of adenomatous polyp of colon     2005 & 2010  . Urgency of urination   . Anxiety   . Neuromuscular disorder     MS   Past Surgical History  Procedure Laterality Date  . Excision soft tissue mass, right chest wall  02-26-2013  . Colonoscopy  last one 2015  . Transthoracic echocardiogram  12-24-2012    Ventricular septum motion showed paradox,  ef 55-60%,   mild MR,  trivial PR and TR   History   Social History  . Marital Status: Married    Spouse Name: bobby Chance  . Number of Children: N/A  . Years of Education: N/A   Occupational History  .     Social History Main Topics  . Smoking status: Never Smoker   . Smokeless tobacco: Never Used  . Alcohol Use: Yes     Comment: 3-4 glasses of wine/week  . Drug Use: No  . Sexual Activity: Not on file   Other Topics Concern  . None   Social History Narrative   Family History  Problem Relation Age of Onset  . Cancer Mother     Uterine ca  . Heart disease Father     Heart Attack  . Hypertension Brother   . Stroke Maternal Aunt   . Diabetes Paternal Aunt   . Diabetes  Brother   . Cancer Brother   . Hypertension Brother    No Known Allergies Prior to Admission medications   Medication Sig Start Date End Date Taking? Authorizing Provider  atorvastatin (LIPITOR) 10 MG tablet Take 10 mg by mouth every morning.    Yes Historical Provider, MD  clonazePAM (KLONOPIN) 0.5 MG tablet Take 0.5 mg by mouth 3 (three) times daily as needed for anxiety (anxiety).    Yes Historical Provider, MD  folic acid (FOLVITE) 099 MCG tablet Take 400 mcg by mouth daily.   Yes Historical Provider, MD  Interferon Beta-1b (BETASERON) 0.3 MG KIT injection Inject 0.25 mg into the skin every other day. Qod  In the pm   Yes Historical Provider, MD  Multiple Vitamins-Minerals (WOMENS ONE DAILY PO) Take 1 tablet by mouth daily.   Yes Historical Provider, MD  Polyethylene Glycol 3350 (MIRALAX PO) Take by mouth as needed.   Yes Historical Provider, MD  tiZANidine (ZANAFLEX) 2 MG tablet Take 2 mg by mouth every 8 (eight) hours as needed for muscle spasms.   Yes Historical Provider, MD  traMADol (ULTRAM) 50 MG tablet Take by mouth every 12 (twelve) hours as needed for moderate pain or severe pain.   Yes Historical Provider, MD  vitamin E 400 UNIT capsule Take  400 Units by mouth daily.   Yes Historical Provider, MD  zolpidem (AMBIEN CR) 12.5 MG CR tablet Take 12.5 mg by mouth at bedtime as needed for sleep (sleep).    Yes Historical Provider, MD  magnesium hydroxide (MILK OF MAGNESIA) 400 MG/5ML suspension Take by mouth daily as needed for mild constipation.    Historical Provider, MD  ondansetron (ZOFRAN) 4 MG tablet Take 1 tablet (4 mg total) by mouth every 6 (six) hours. 01/27/15   Virgel Manifold, MD  oxybutynin (DITROPAN) 5 MG tablet Take 5 mg by mouth 3 (three) times daily as needed.    Historical Provider, MD  oxyCODONE-acetaminophen (PERCOCET/ROXICET) 5-325 MG per tablet Take 1-2 tablets by mouth every 4 (four) hours as needed for severe pain. 01/27/15   Virgel Manifold, MD  tamsulosin (FLOMAX) 0.4  MG CAPS capsule Take 0.4 mg by mouth daily after supper.    Historical Provider, MD     Positive ROS: All other systems have been reviewed and were otherwise negative with the exception of those mentioned in the HPI and as above.  Physical Exam: General: Alert, no acute distress Cardiovascular: No pedal edema Respiratory: No cyanosis, no use of accessory musculature GI: No organomegaly, abdomen is soft and non-tender Skin: No lesions in the area of chief complaint Neurologic: Sensation intact distally Psychiatric: Patient is competent for consent with normal mood and affect Lymphatic: No axillary or cervical lymphadenopathy  MUSCULOSKELETAL: Right foot has positive swelling over the fifth metatarsal with positive pain to palpation in that location and sensation intact distally and good capillary refill.  Assessment: Displaced right fifth metatarsal fracture, at the base  Plan: Plan for Procedure(s): Open reduction internal fixation right fifth metatarsal base fracture  The risks benefits and alternatives were discussed with the patient including but not limited to the risks of nonoperative treatment, versus surgical intervention including infection, bleeding, nerve injury, malunion, nonunion, the need for revision surgery, hardware prominence, hardware failure, the need for hardware removal, blood clots, cardiopulmonary complications, morbidity, mortality, among others, and they were willing to proceed.     Johnny Bridge, MD Cell [phone]   04/08/2015 10:05 AM

## 2015-04-08 NOTE — Discharge Instructions (Signed)
Diet: As you were doing prior to hospitalization   Shower:  May shower but keep the wounds dry, use an occlusive plastic wrap, NO SOAKING IN TUB.  If the bandage gets wet, change with a clean dry gauze.  Dressing:  You may change your dressing 3-5 days after surgery.  Then change the dressing daily with sterile gauze dressing.    There are sticky tapes (steri-strips) on your wounds and all the stitches are absorbable.  Leave the steri-strips in place when changing your dressings, they will peel off with time, usually 2-3 weeks.  Activity:  Increase activity slowly as tolerated, but follow the weight bearing instructions below.  No lifting or driving for 6 weeks.  Weight Bearing:   Nonweightbearing.    To prevent constipation: you may use a stool softener such as -  Colace (over the counter) 100 mg by mouth twice a day  Drink plenty of fluids (prune juice may be helpful) and high fiber foods Miralax (over the counter) for constipation as needed.    Itching:  If you experience itching with your medications, try taking only a single pain pill, or even half a pain pill at a time.  You may take up to 10 pain pills per day, and you can also use benadryl over the counter for itching or also to help with sleep.   Precautions:  If you experience chest pain or shortness of breath - call 911 immediately for transfer to the hospital emergency department!!  If you develop a fever greater that 101 F, purulent drainage from wound, increased redness or drainage from wound, or calf pain -- Call the office at [phone]                                                Follow- Up Appointment:  Please call for an appointment to be seen in 2 weeks [facility] - [phone]  Regional Anesthesia Blocks  1. Numbness or the inability to move the "blocked" extremity may last from 3-48 hours after placement. The length of time depends on the medication injected and your individual response to the medication. If the  numbness is not going away after 48 hours, call your surgeon.  2. The extremity that is blocked will need to be protected until the numbness is gone and the  Strength has returned. Because you cannot feel it, you will need to take extra care to avoid injury. Because it may be weak, you may have difficulty moving it or using it. You may not know what position it is in without looking at it while the block is in effect.  3. For blocks in the legs and feet, returning to weight bearing and walking needs to be done carefully. You will need to wait until the numbness is entirely gone and the strength has returned. You should be able to move your leg and foot normally before you try and bear weight or walk. You will need someone to be with you when you first try to ensure you do not fall and possibly risk injury.  4. Bruising and tenderness at the needle site are common side effects and will resolve in a few days.  5. Persistent numbness or new problems with movement should be communicated to the surgeon or the [facility] [phone] [place] [phone]).  Post Anesthesia Home Care Instructions  Activity: Get plenty of rest for the remainder of the day. A responsible adult should stay with you for 24 hours following the procedure.  For the next 24 hours, DO NOT: -Drive a car -Paediatric nurse -Drink alcoholic beverages -Take any medication unless instructed by your physician -Make any legal decisions or sign important papers.  Meals: Start with liquid foods such as gelatin or soup. Progress to regular foods as tolerated. Avoid greasy, spicy, heavy foods. If nausea and/or vomiting occur, drink only clear liquids until the nausea and/or vomiting subsides. Call your physician if vomiting continues.  Special Instructions/Symptoms: Your throat may feel dry or sore from the anesthesia or the breathing tube placed in your throat during surgery. If this causes  discomfort, gargle with warm salt water. The discomfort should disappear within 24 hours.  If you had a scopolamine patch placed behind your ear for the management of post- operative nausea and/or vomiting:  1. The medication in the patch is effective for 72 hours, after which it should be removed.  Wrap patch in a tissue and discard in the trash. Wash hands thoroughly with soap and water. 2. You may remove the patch earlier than 72 hours if you experience unpleasant side effects which may include dry mouth, dizziness or visual disturbances. 3. Avoid touching the patch. Wash your hands with soap and water after contact with the patch.

## 2015-04-11 ENCOUNTER — Encounter (HOSPITAL_BASED_OUTPATIENT_CLINIC_OR_DEPARTMENT_OTHER): Payer: Self-pay | Admitting: Orthopedic Surgery

## 2015-04-11 NOTE — Addendum Note (Signed)
Addendum  created 04/11/15 1049 by Tawni Millers, CRNA   Modules edited: Charges VN

## 2015-06-07 ENCOUNTER — Other Ambulatory Visit: Payer: Self-pay

## 2015-06-07 DIAGNOSIS — Z1231 Encounter for screening mammogram for malignant neoplasm of breast: Secondary | ICD-10-CM

## 2015-07-22 ENCOUNTER — Ambulatory Visit: Payer: 59

## 2016-07-13 IMAGING — DX DG KNEE COMPLETE 4+V*R*
4 series · 4 of 4 positions shown · non-contrast
Comparison: None.

CLINICAL DATA: Fall from standing height with right knee injury and
medial pain.

EXAM:
RIGHT KNEE - COMPLETE 4+ VIEW

[knee ap]
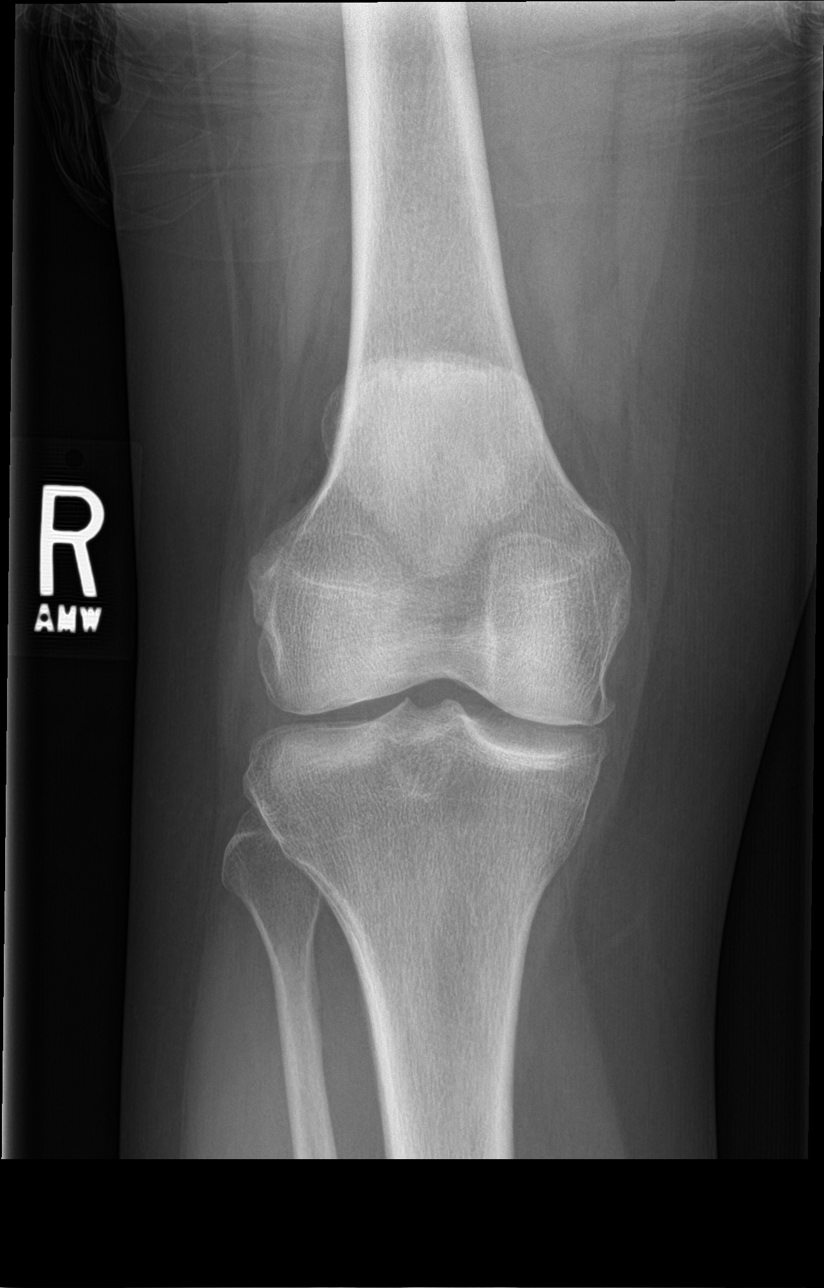

[knee obl (1 of 2)]
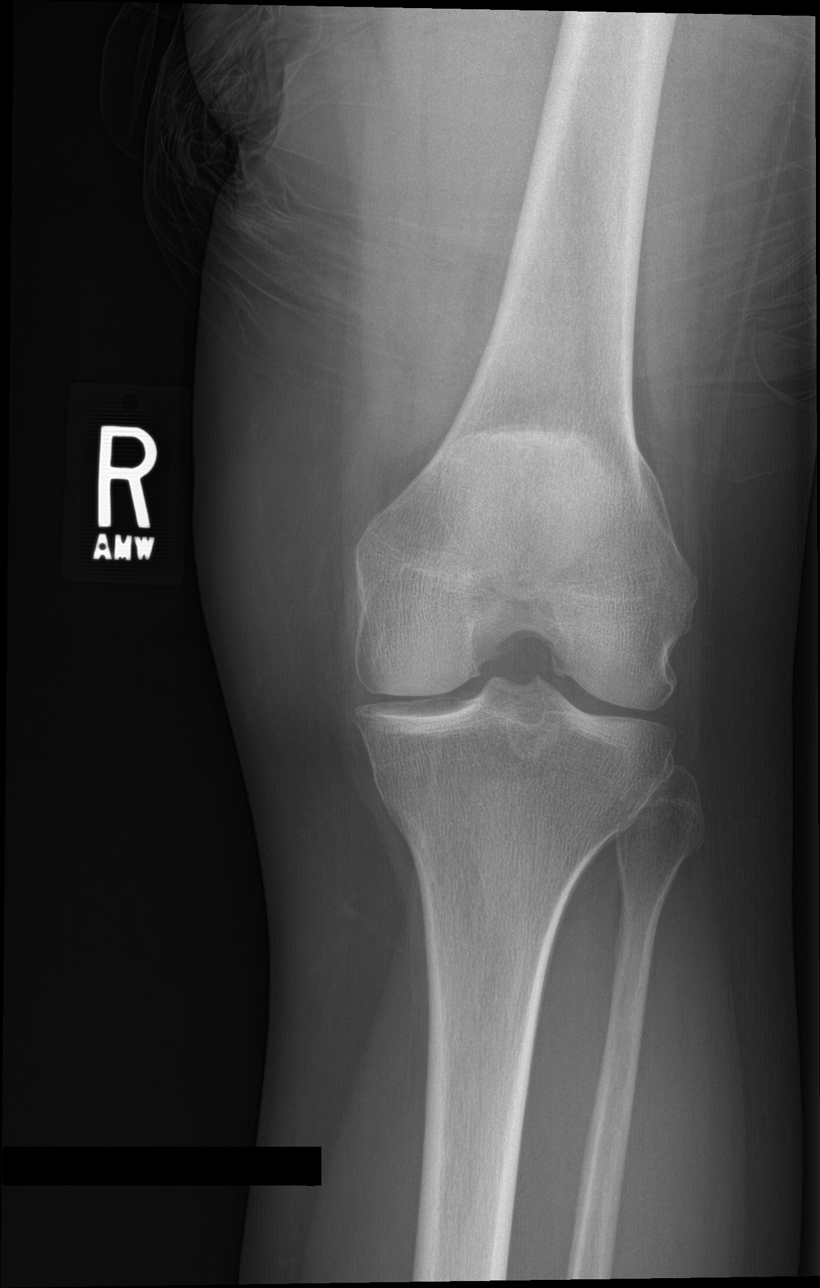

[knee obl (2 of 2)]
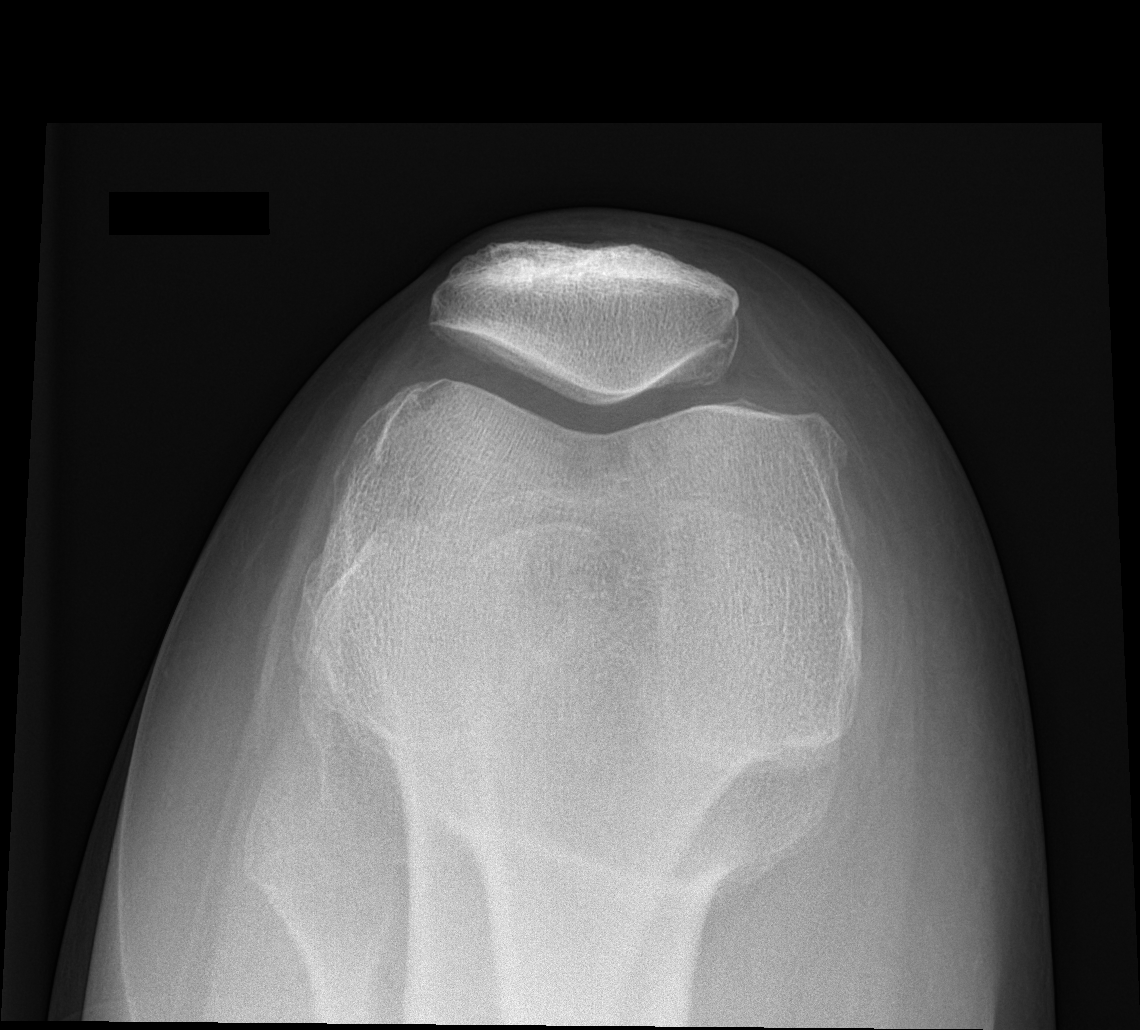

[knee lat]
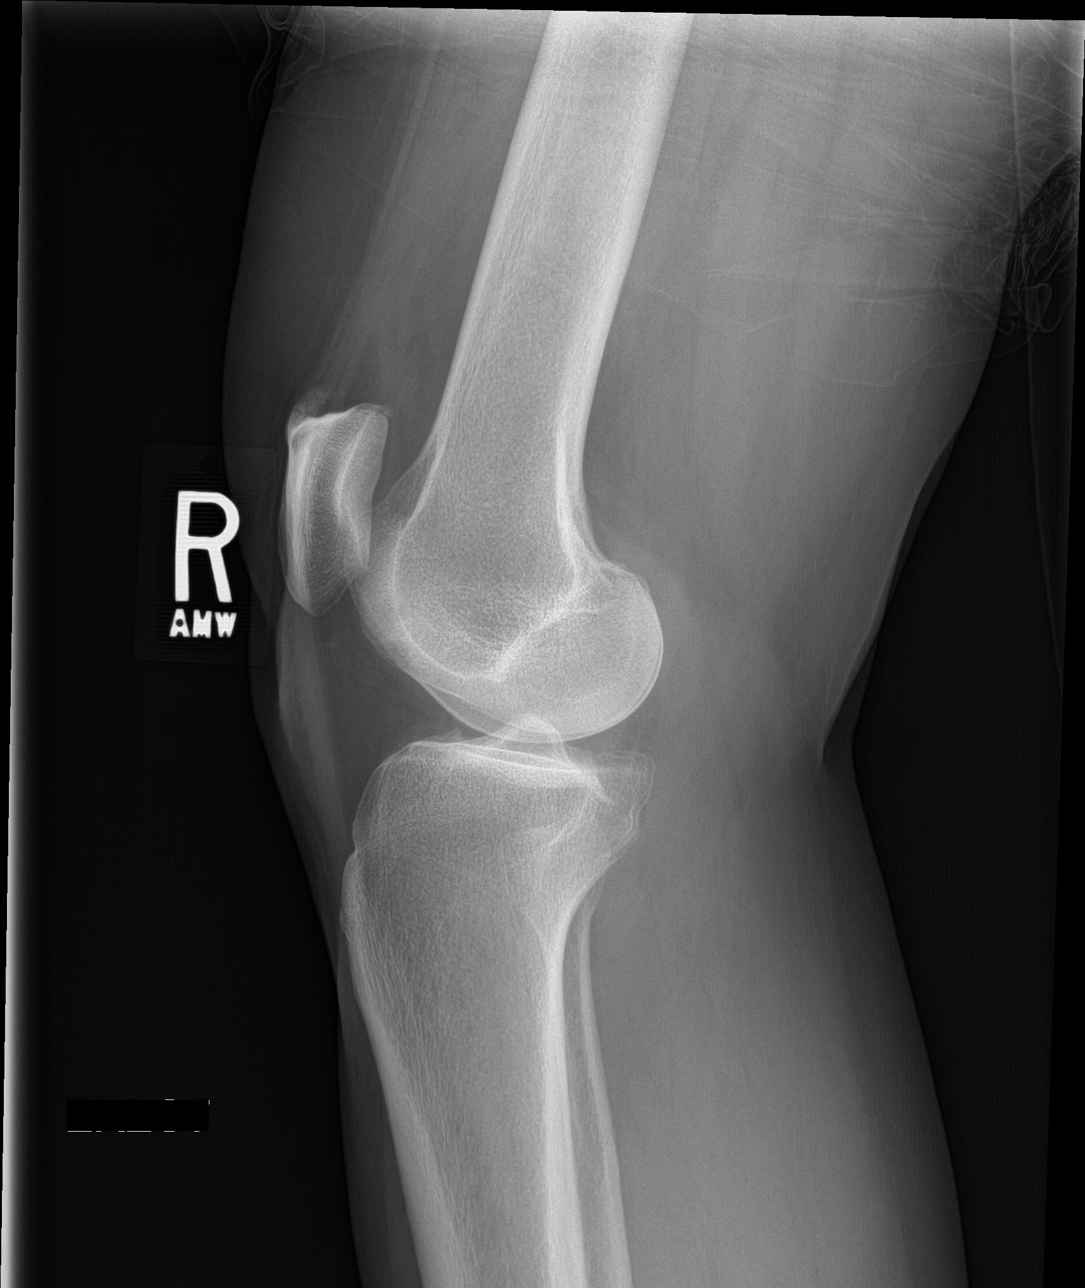

[4 of 4 positions shown; findings below may reference images not displayed]

FINDINGS: Examination demonstrates mild tricompartmental osteoarthritic
change. There is no acute fracture or dislocation. No significant
joint effusion.
IMPRESSION: No acute findings.

## 2016-07-13 IMAGING — DX DG FOOT COMPLETE 3+V*R*
3 series · 3 of 3 positions shown · non-contrast
Comparison: None.

CLINICAL DATA: Fall from standing height with fifth metatarsal pain
and swelling.

EXAM:
RIGHT FOOT COMPLETE - 3+ VIEW

[foot ap]
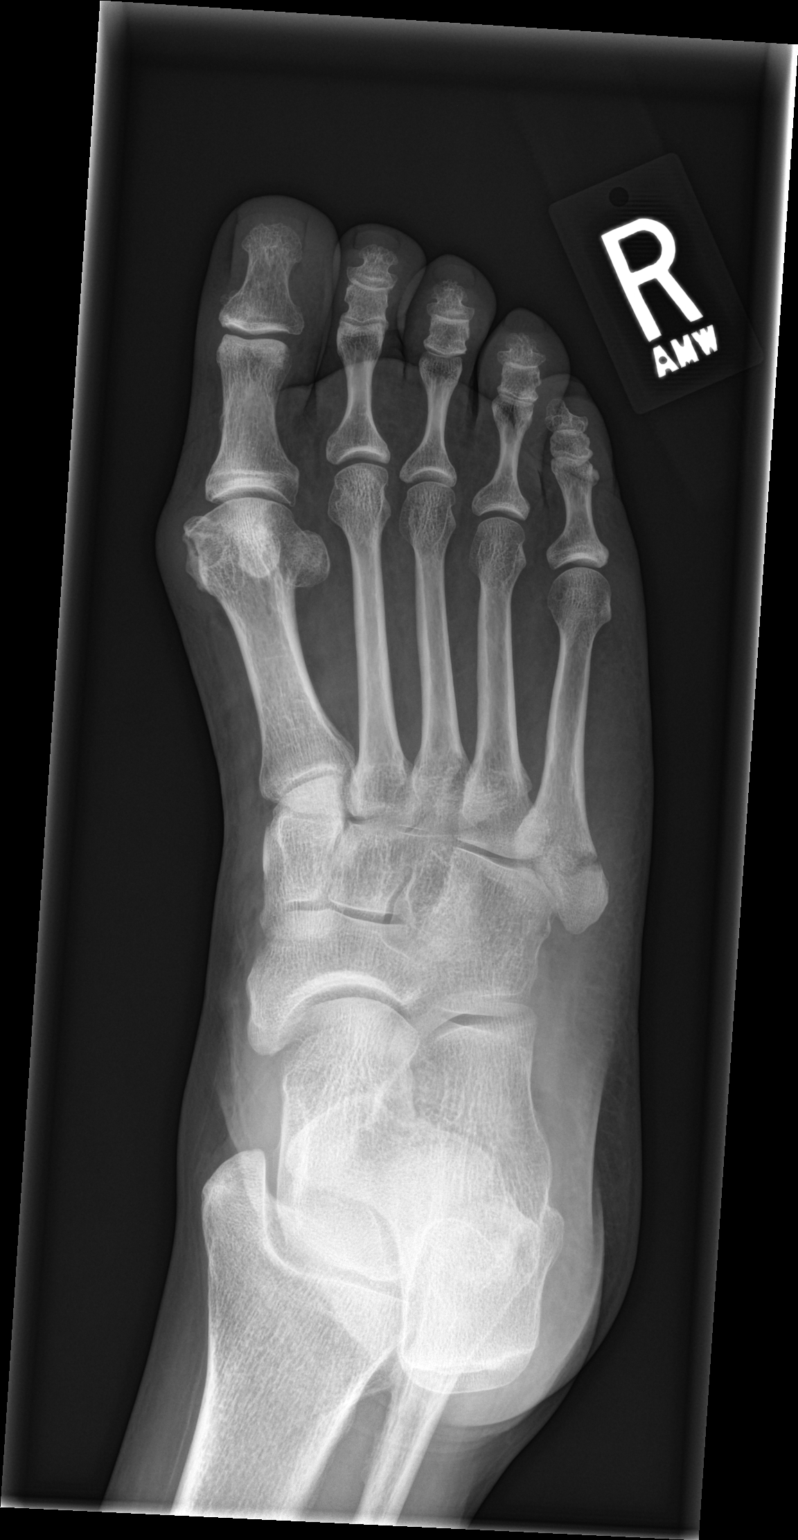

[foot obl]
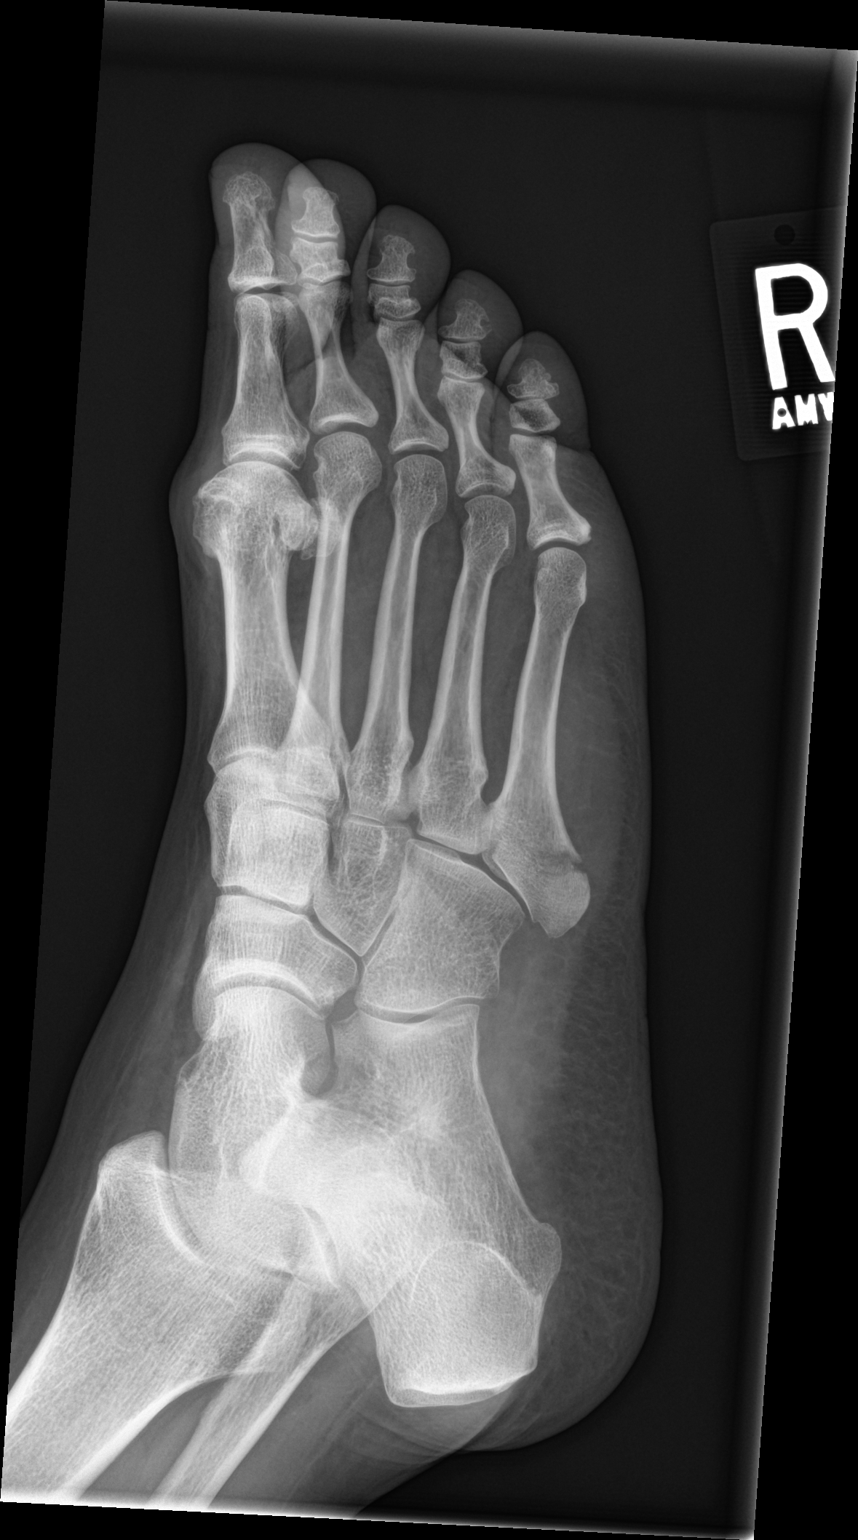

[foot lat]
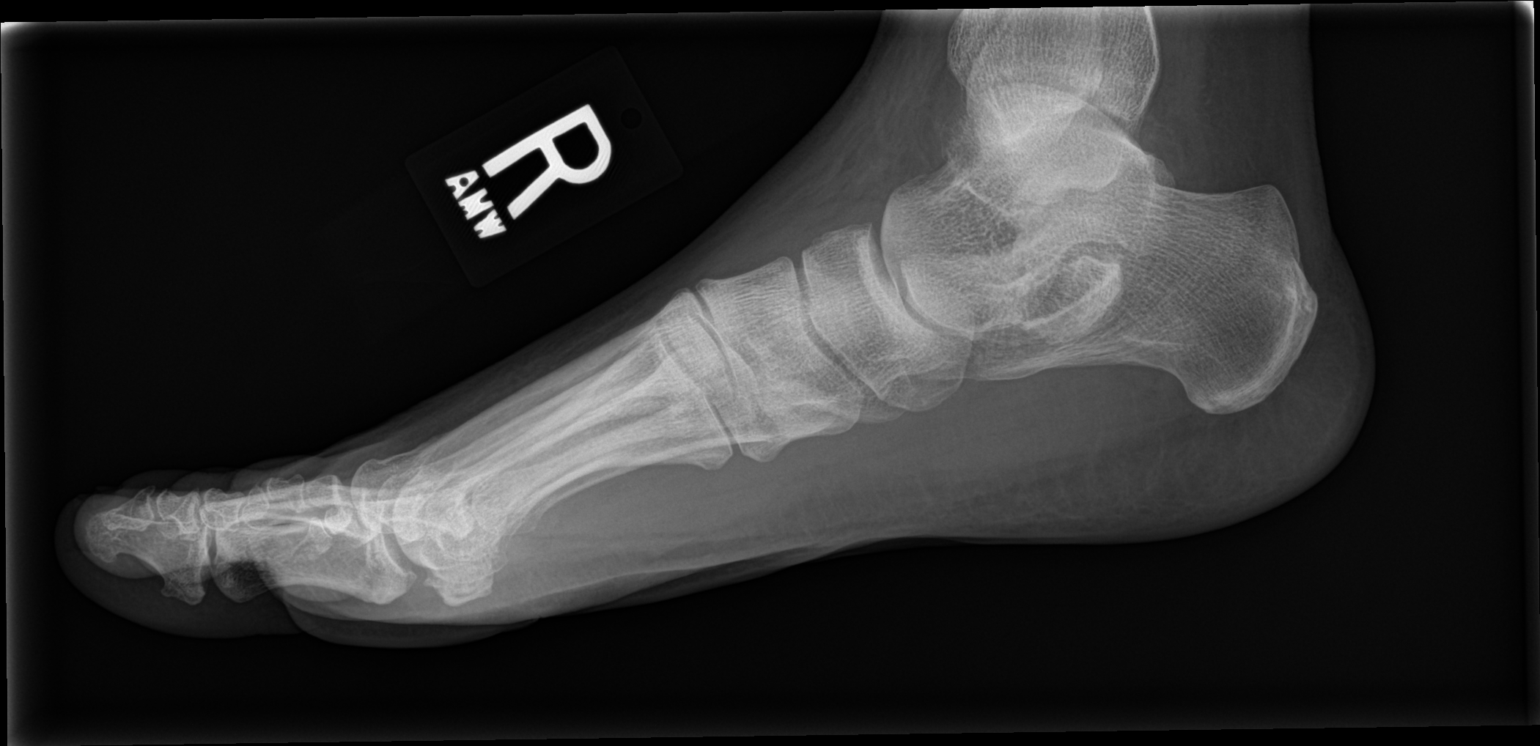

[3 of 3 positions shown; findings below may reference images not displayed]

FINDINGS: Mild degenerative change over the first MTP joint. Minimal
degenerative change over the midfoot. Minimally displaced transverse
fracture through the base of the fifth metatarsal.
IMPRESSION: Minimally displaced transverse fracture at the base of the fifth
metatarsal.

## 2016-11-09 DIAGNOSIS — M546 Pain in thoracic spine: Secondary | ICD-10-CM | POA: Diagnosis not present

## 2016-11-09 DIAGNOSIS — M62838 Other muscle spasm: Secondary | ICD-10-CM | POA: Diagnosis not present

## 2017-03-29 DIAGNOSIS — Z0000 Encounter for general adult medical examination without abnormal findings: Secondary | ICD-10-CM | POA: Diagnosis not present

## 2017-03-29 DIAGNOSIS — E785 Hyperlipidemia, unspecified: Secondary | ICD-10-CM | POA: Diagnosis not present

## 2017-05-06 DIAGNOSIS — D696 Thrombocytopenia, unspecified: Secondary | ICD-10-CM | POA: Diagnosis not present

## 2017-05-17 ENCOUNTER — Telehealth: Payer: Self-pay

## 2017-05-17 NOTE — Telephone Encounter (Signed)
Sent notes to scheduling 

## 2017-06-06 DIAGNOSIS — D696 Thrombocytopenia, unspecified: Secondary | ICD-10-CM | POA: Diagnosis not present

## 2017-06-13 ENCOUNTER — Telehealth: Payer: Self-pay | Admitting: Cardiology

## 2017-06-13 NOTE — Telephone Encounter (Signed)
Received records from [facility] for appointment on 06/18/17 with Dr Ellyn Hack.  Records put with Dr Allison Quarry schedule for 06/18/17. lp

## 2017-06-18 ENCOUNTER — Ambulatory Visit (INDEPENDENT_AMBULATORY_CARE_PROVIDER_SITE_OTHER): Payer: 59 | Admitting: Cardiology

## 2017-06-18 ENCOUNTER — Ambulatory Visit: Payer: 59 | Admitting: Cardiology

## 2017-06-18 ENCOUNTER — Encounter: Payer: Self-pay | Admitting: Cardiology

## 2017-06-18 VITALS — BP 104/76 | HR 66 | Ht 66.0 in | Wt 161.0 lb

## 2017-06-18 DIAGNOSIS — M25473 Effusion, unspecified ankle: Secondary | ICD-10-CM

## 2017-06-18 DIAGNOSIS — R000 Tachycardia, unspecified: Secondary | ICD-10-CM

## 2017-06-18 DIAGNOSIS — E7800 Pure hypercholesterolemia, unspecified: Secondary | ICD-10-CM | POA: Diagnosis not present

## 2017-06-18 DIAGNOSIS — R002 Palpitations: Secondary | ICD-10-CM | POA: Diagnosis not present

## 2017-06-18 NOTE — Progress Notes (Signed)
PCP: Leighton Ruff, MD  Clinic Note: Chief Complaint  Patient presents with  . New Patient (Initial Visit)    referral for palpitations    HPI: Barbara Ellison is a 61 y.o. female who is being seen today for the evaluation of Rapid heartbeat/palpitations and irregular EKG at the request of Leighton Ruff, MD. She has a history of multiple sclerosis and hyperlipidemia but no heart history. Her neurologist is through [facility] has had a 2-D echocardiogram the past, but has not had any cardiac evaluation.  Recent Hospitalizations: None  Studies Personally Reviewed - (if available, images/films reviewed: From Epic Chart or Care Everywhere)  2D Echo Feb 2014: EF 55-60%. Normal wall motion exception of paradoxical septal motion from IVCD/LBBB.  Interval History: [name] presents here today with several month complaint of brief episodes of fast irregular heartbeats. None of the symptoms in the last more than 2 minutes, and usually lasting only 30 seconds to a minute. These can happen regardless of the setting. They can happen with walking, sitting or lying down. Nothing seems to trigger them, they come out of the blue. Not necessarily associated with stress or anxiety, but when they occur she does feel anxious. She may have them as few as once a week or up to 3 or 4 times a week. She gets a little bit nauseous when they occur and may be a bit dizzy but not enough to feel like she is to pass out. No chest pain or dyspnea.  She has not had any PND or orthopnea, but has had some lower extremity ankle swelling that occasionally it occurs worse in the summer. She has some generalized fatigue, but this is not a new finding for her.   No TIA/amaurosis fugax symptoms. No claudication.  ROS: A comprehensive was performed. Review of Systems  Constitutional: Positive for malaise/fatigue (Mild general fatigue, but no specific exercise  intolerance).  HENT: Negative for congestion and nosebleeds.   Respiratory: Negative for cough.   Gastrointestinal: Negative for blood in stool, constipation, diarrhea and melena.  Musculoskeletal: Positive for joint pain (Mild arthritis pains).  Neurological: Positive for dizziness (Sometimes with her multiple sclerosis).       Has not had a recent multiple sclerosis flare  Psychiatric/Behavioral: Negative for depression and memory loss. The patient is not nervous/anxious and does not have insomnia.   All other systems reviewed and are negative.  I have reviewed and (if needed) personally updated the patient's problem list, medications, allergies, past medical and surgical history, social and family history.   Past Medical History:  Diagnosis Date  . Anxiety   . Fracture of fifth metatarsal bone of right foot 04/08/2015  . History of abnormal cervical Pap smear   . History of adenomatous polyp of colon    2005 & 2010  . Left ureteral stone   . Multiple sclerosis ([place]) dx 2001 (approx.) relapsing-remitting   dr Raquel Sarna pharr--  [facility]  . Neuromuscular disorder ([place])    MS  . Urgency of urination     Past Surgical History:  Procedure Laterality Date  . COLONOSCOPY  last one 2015  . EXCISION SOFT TISSUE MASS, RIGHT CHEST WALL  02-26-2013  . ORIF TOE FRACTURE Right 04/08/2015   Procedure: OPEN REDUCTION INTERNAL FIXATION (ORIF) RIGHT METATARSAL (TOE) FRACTURE;  Surgeon: Marchia Bond, MD;  Location: [facility];  Service: Orthopedics;  Laterality: Right;  . TRANSTHORACIC ECHOCARDIOGRAM  12-24-2012  Ventricular septum motion showed paradox,  ef 55-60%,   mild MR,  trivial PR and TR    Current Meds  Medication Sig  . atorvastatin (LIPITOR) 10 MG tablet Take 10 mg by mouth every morning.   . clonazePAM (KLONOPIN) 0.5 MG tablet Take 0.5 mg by mouth 3 (three) times daily as needed for anxiety (anxiety).   . Interferon Beta-1b (BETASERON) 0.3 MG KIT injection Inject 0.25 mg  into the skin every other day. Qod  In the pm  . Multiple Vitamins-Minerals (WOMENS ONE DAILY PO) Take 1 tablet by mouth daily.  Marland Kitchen oxybutynin (DITROPAN) 5 MG tablet Take 5 mg by mouth 3 (three) times daily as needed.  . vitamin E 400 UNIT capsule Take 400 Units by mouth daily.  Marland Kitchen zolpidem (AMBIEN CR) 12.5 MG CR tablet Take 12.5 mg by mouth at bedtime as needed for sleep (sleep).     No Known Allergies  Social History   Social History  . Marital status: Married    Spouse name: Oliviarose Punch  . Number of children: N/A  . Years of education: N/A   Occupational History  .  [place] History Main Topics  . Smoking status: Never Smoker  . Smokeless tobacco: Never Used  . Alcohol use Yes     Comment: 3-4 glasses of wine/week  . Drug use: No  . Sexual activity: Not Asked   Other Topics Concern  . None   Social History Narrative  . None    family history includes Cancer in her brother and mother; Diabetes in her brother and paternal aunt; Diabetes Mellitus II in her brother; Heart attack in her father; Heart disease (age of onset: 53) in her father; High Cholesterol in her brother and brother; Hypertension in her brother and brother; Stroke in her maternal aunt.  Wt Readings from Last 3 Encounters:  06/18/17 161 lb (73 kg)  04/08/15 155 lb (70.3 kg)  02/04/15 158 lb (71.7 kg)    PHYSICAL EXAM BP 104/76   Pulse 66   Ht '5\' 6"'  (1.676 m)   Wt 161 lb (73 kg)   BMI 25.99 kg/m  Physical Exam  Constitutional: She is oriented to person, place, and time. She appears well-developed and well-nourished.  Well-groomed  HENT:  Head: Normocephalic and atraumatic.  Mouth/Throat: No oropharyngeal exudate.  Eyes: Pupils are equal, round, and reactive to light. Conjunctivae and EOM are normal. No scleral icterus.  Neck: Normal range of motion. Neck supple. No hepatojugular reflux and no JVD present. Carotid bruit is not present.  Cardiovascular: Normal rate, regular  rhythm, normal heart sounds and intact distal pulses.  Exam reveals no gallop and no friction rub.   No murmur heard. Pulmonary/Chest: Breath sounds normal. No respiratory distress. She has no wheezes. She has no rales.  Abdominal: Soft. Bowel sounds are normal. She exhibits no distension. There is no tenderness. There is no rebound.  Musculoskeletal: Normal range of motion. She exhibits no edema or deformity.  Neurological: She is alert and oriented to person, place, and time. No cranial nerve deficit.  Skin: Skin is warm and dry. No erythema.  Psychiatric: She has a normal mood and affect. Her behavior is normal. Judgment and thought content normal.  Nursing note and vitals reviewed.    Adult ECG Report  Rate: 66 ;  Rhythm: normal sinus rhythm and LVH with repolarization abnormality and widened QRS with IVCD/left bundle branch pattern and left axis deviation (-52. He not rule  out anterior lateral MI, age indeterminate, however most likely consistent with LVH/IVCD repolarization abnormality.;   Narrative Interpretation: essentially identical to 2014 EKG & PCPs EKG (reviewed   Other studies Reviewed: Additional studies/ records that were reviewed today include:  Recent Labs: 03/29/2017  Na+ 141, K+ 4.1, Cl- 105, HCO3- 29 , BUN 15, Cr 0.61, Glu 85, Ca2+ 9.8; AST 35, ALT, 37, AlkP 75,  T Bili 0.5, TP 7.1,Alb 4.4  CBC: W 4.4, H/H 12.7/38.5, Plt 132  TC 158, TG 88, HDL 56, LDL 85; TSH 1.37   ASSESSMENT / PLAN: Problem List Items Addressed This Visit    Ankle edema (Chronic)    Probably related to mild venous insufficiency. There may be some relation to multiple sclerosis, did not seem to be all that edematous to me today. I agree with limiting salt intake and feet elevation. Avoid diuretic.      Hypercholesterolemia (Chronic)    On statin. Monitored by PCP.      RESOLVED: Rapid heart rate   Relevant Orders   Cardiac event monitor   Rapid palpitations - Primary    She is  describing short episodes of regular/account oh-heartbeats. He does not seem to be any irregularity to it. This would suggest that she may be having brief runs of PAT or SVT. May be fast bigeminy, but it doesn't sound like A. fib. Sounds longer than just rare PACs or PVCs. These episodes are not happening every day or every couple days, but maybe once or twice a week. No real rhyme or reason. The only way we will know what we are treating his to capture an event on a monitor. Plan: At least 2 week monitor      Relevant Orders   EKG 12-Lead (Completed)   Cardiac event monitor      Current medicines are reviewed at length with the patient today. (+/- concerns) None The following changes have been made: None  Patient Instructions  SCHEDULE AT [facility] has recommended that you wear an event monitor 14 DAYS. Event monitors are medical devices that record the heart's electrical activity. Doctors most often [place] these monitors to diagnose arrhythmias. Arrhythmias are problems with the speed or rhythm of the heartbeat. The monitor is a small, portable device. You can wear one while you do your normal daily activities. This is usually used to diagnose what is causing palpitations/syncope (passing out).     NO CHANGES WITH MEDICATION AT PRESENT TIME   Your physician recommends that you schedule a follow-up appointment in [place].    Studies Ordered:   Orders Placed This Encounter  Procedures  . Cardiac event monitor  . EKG 12-Lead      Glenetta Hew, M.D., M.S. Interventional Cardiologist   Pager # [phone] Phone # [phone] [address] [place], [place] [address]

## 2017-06-18 NOTE — Patient Instructions (Signed)
SCHEDULE AT [facility] has recommended that you wear an event monitor 14 DAYS. Event monitors are medical devices that record the heart's electrical activity. Doctors most often [place] these monitors to diagnose arrhythmias. Arrhythmias are problems with the speed or rhythm of the heartbeat. The monitor is a small, portable device. You can wear one while you do your normal daily activities. This is usually used to diagnose what is causing palpitations/syncope (passing out).     NO CHANGES WITH MEDICATION AT PRESENT TIME   Your physician recommends that you schedule a follow-up appointment in [place].

## 2017-06-21 ENCOUNTER — Encounter: Payer: Self-pay | Admitting: Cardiology

## 2017-06-21 NOTE — Assessment & Plan Note (Signed)
Probably related to mild venous insufficiency. There may be some relation to multiple sclerosis, did not seem to be all that edematous to me today. I agree with limiting salt intake and feet elevation. Avoid diuretic.

## 2017-06-21 NOTE — Assessment & Plan Note (Signed)
She is describing short episodes of regular/account oh-heartbeats. He does not seem to be any irregularity to it. This would suggest that she may be having brief runs of PAT or SVT. May be fast bigeminy, but it doesn't sound like A. fib. Sounds longer than just rare PACs or PVCs. These episodes are not happening every day or every couple days, but maybe once or twice a week. No real rhyme or reason. The only way we will know what we are treating his to capture an event on a monitor. Plan: At least 2 week monitor

## 2017-06-21 NOTE — Assessment & Plan Note (Signed)
On statin. Monitored by PCP. 

## 2017-07-29 DIAGNOSIS — I471 Supraventricular tachycardia, unspecified: Secondary | ICD-10-CM

## 2017-07-29 HISTORY — DX: Supraventricular tachycardia, unspecified: I47.10

## 2017-07-29 HISTORY — DX: Supraventricular tachycardia: I47.1

## 2017-08-01 ENCOUNTER — Ambulatory Visit: Payer: 59 | Admitting: Cardiology

## 2017-08-14 ENCOUNTER — Ambulatory Visit (INDEPENDENT_AMBULATORY_CARE_PROVIDER_SITE_OTHER): Payer: 59

## 2017-08-14 DIAGNOSIS — R000 Tachycardia, unspecified: Secondary | ICD-10-CM | POA: Diagnosis not present

## 2017-08-14 DIAGNOSIS — R002 Palpitations: Secondary | ICD-10-CM

## 2017-08-15 ENCOUNTER — Telehealth: Payer: Self-pay | Admitting: Cardiology

## 2017-08-15 DIAGNOSIS — D696 Thrombocytopenia, unspecified: Secondary | ICD-10-CM | POA: Diagnosis not present

## 2017-08-15 NOTE — Telephone Encounter (Signed)
MD has reviewed monitor report. No changes needed. Patient to continue wearing monitor for duration of order and follow up on 10/31 as planned.

## 2017-08-15 NOTE — Telephone Encounter (Signed)
Received fax from [facility] with "serious notification" on patient's monitor.   Date: 08/14/17 @ approx 530pm (CT) patient had auto-trigger episode of ST with PACs - HR 153bpm. Second episode @ approx 540pm (CT) another auto-trigger episode of ST - HR 150bpm.   Will have MD review report

## 2017-08-23 ENCOUNTER — Telehealth: Payer: Self-pay | Admitting: Cardiology

## 2017-08-23 NOTE — Telephone Encounter (Signed)
Pt wants to know if there is enough data recorded on her monitor that she can stop wearing it. She says it is irritating her skin really bad and really uncomfortable.

## 2017-08-23 NOTE — Telephone Encounter (Signed)
Patient had monitor placed 08/14/17 - [employer] - ordered for 14 days. Would like to remove monitor early d/t skin irritation. Has MD follow up on 11/14  Review of monitor symptoms and rhythm analysis online: 10/17 - flutter or skipped beats -- SR 10/17 - auto detect - ST w/PACs 10/20 - tired/fatigued - ST 10/21 - tired/fatifued - ST w/artifact 10/21 - flutter or skipped beats, rapid or fast heartbeat - SR w/couplet PVCs/atrial run  Routed to MD/RN to review & advise

## 2017-08-26 NOTE — Telephone Encounter (Signed)
Monitor did show what looks like PSVT - probably enough data. OK for homework.  Glenetta Hew, MD

## 2017-08-26 NOTE — Telephone Encounter (Signed)
LEFT MESSAGE  PER DR HARDING OKAY TO RETURN MONITOR  EARLY - CALL BACK TO DISCUSS F/U APPOINTMENT

## 2017-08-26 NOTE — Telephone Encounter (Signed)
PATIENT AWARE . WILL KEEP APPOINTMENT 11/14 /18

## 2017-08-28 ENCOUNTER — Ambulatory Visit: Payer: 59 | Admitting: Cardiology

## 2017-09-11 ENCOUNTER — Ambulatory Visit: Payer: 59 | Admitting: Cardiology

## 2017-09-11 ENCOUNTER — Encounter: Payer: Self-pay | Admitting: Cardiology

## 2017-09-11 VITALS — BP 116/80 | HR 72 | Ht 66.0 in | Wt 155.0 lb

## 2017-09-11 DIAGNOSIS — R002 Palpitations: Secondary | ICD-10-CM | POA: Diagnosis not present

## 2017-09-11 DIAGNOSIS — Z01419 Encounter for gynecological examination (general) (routine) without abnormal findings: Secondary | ICD-10-CM | POA: Diagnosis not present

## 2017-09-11 DIAGNOSIS — E7800 Pure hypercholesterolemia, unspecified: Secondary | ICD-10-CM

## 2017-09-11 DIAGNOSIS — I471 Supraventricular tachycardia: Secondary | ICD-10-CM | POA: Diagnosis not present

## 2017-09-11 DIAGNOSIS — N644 Mastodynia: Secondary | ICD-10-CM | POA: Diagnosis not present

## 2017-09-11 DIAGNOSIS — Z1231 Encounter for screening mammogram for malignant neoplasm of breast: Secondary | ICD-10-CM | POA: Diagnosis not present

## 2017-09-11 DIAGNOSIS — R87619 Unspecified abnormal cytological findings in specimens from cervix uteri: Secondary | ICD-10-CM | POA: Diagnosis not present

## 2017-09-11 NOTE — Patient Instructions (Signed)
NO CHANGES WITH MEDICATIONS   MONITOR SHOWED  CONDITION SVT ( SUPRAVENTRICULAR TACHYCARDIA)  Recommendations for vagal maneuvers:  "Bearing down"  Coughing  Gagging  Icy, cold towel on face or drink ice cold water   Your physician wants you to follow-up in [place]. You will receive a reminder letter in the mail two months in advance. If you don't receive a letter, please call our office to schedule the follow-up appointment.     Supraventricular Tachycardia, Adult Supraventricular tachycardia (SVT) is a type of abnormal heart rhythm. It causes the heart to beat very quickly and then return to a normal speed. A normal heart rate is 60-100 beats per minute. During an episode of SVT, your heart rate may be higher than 150 beats per minute. Episodes of SVT can be frightening, but they are usually not dangerous. However, if episodes happens often or last for long periods of time, they may lead to heart failure. What are the causes? Usually, a normal heartbeat starts when an area called the sinoatrial node releases an electrical signal. In SVT, other areas of the heart send out electrical signals that interfere with the signal from the sinoatrial node. It is not known why some people get SVT and others do not. What increases the risk? This condition is more likely to develop in:  People who are 33?60 years old.  Women.  Factors that may increase your chances of an attack include:  Stress.  Tiredness.  Smoking.  Stimulant drugs, such as cocaine and methamphetamine.  Alcohol.  Caffeine.  Pregnancy.  Anxiety.  What are the signs or symptoms? Symptoms of this condition include:  A pounding heart.  A feeling that the heart is skipping beats (palpitations).  Weakness.  Shortness of breath.  Tightness or pain in your chest.  Light-headedness.  Anxiety.  Dizziness.  Sweating.  Nausea.  Fainting.  Fatigue or tiredness.  A mild episode  may not cause symptoms. How is this diagnosed? This condition may be diagnosed based on:  Your symptoms.  A physical exam. If you are have an episode of SVT during the exam, the health care provider may be able to diagnose SVT by listening to your heart and feeling your pulse.  Tests. These may include: ? An electrocardiogram (ECG). This test is done to check for problems with electrical activity in the heart. ? A Holter monitor or event monitor test. This test involves wearing a portable device that monitors your heart rate over time. ? An echocardiogram. This test involves taking an image of your heart using sound waves. It is done to rule out other causes of a fast heart rate. ? Blood tests.  How is this treated? This condition may be treated with:  Vagal nerve stimulation. The treatment involves stimulating your vagus nerve, which slows down the heart. It is often the first and only treatment that is needed for this condition. It is a good idea to try the several ways of doing vagal stimulation to find which one works best for you. Ways to do this treatment include: ? Holding your breath and pushing, as though you are having a bowel movement. ? Massaging an area on one side of your neck, below your jaw. Do not try this yourself. Only a health care provider should do this. If done the wrong way, it can lead to a stroke. ? Bending forward with your head between your legs. ? Coughing while bending forward with your head between your  legs. ? Closing your eyes and massaging your eyeballs. A health care provider should guide you through this method before you try it on your own.  Medicines that prevent attacks.  Medicine to stop an attack. The medicine is given through an IV tube at the hospital.  A small electric shock (cardioversion) that stops an attack. Before you get the shock, you will get medicine to make you fall asleep.  Radiofrequency ablation. In this procedure, a small, thin  tube (catheter) is used to send radiofrequency energy to the area of tissue that is causing the rapid heartbeats. The energy kills the cells and helps your heart keep a normal rhythm. You may have this treatment if you have symptoms of SVT often.  If you do not have symptoms, you may not need treatment. Follow these instructions at home: Stress  Avoid stressful situations when possible.  Find healthy ways of managing stress that work for you. Some healthy ways to manage stress include: ? Taking part in relaxing activities, such as yoga, meditation, or being out in nature. ? Listening to relaxing music. ? Practicing relaxation techniques, such as deep breathing. ? Leading a healthy lifestyle. This involves getting plenty of sleep, exercising, and eating a balanced diet. ? Attending counseling or talk therapy with a mental health professional. Sleep  Try to get at least 7 hours of sleep each night. Tobacco and nicotine  Do not use any products that contain nicotine or tobacco, such as cigarettes and e-cigarettes. If you need help quitting, ask your health care provider. Alcohol  If alcohol triggers episodes of SVT, do not drink alcohol.  If alcohol does not seem to trigger episodes, limit alcohol intake to no more than 1 drink a day for nonpregnant women and 2 drinks a day for men. One drink equals 12 oz of beer, 5 oz of wine, or 1 oz of hard liquor. Caffeine  If caffeine triggers episodes of SVT, do not eat, drink, or use anything with caffeine in it.  If caffeine does not seem to trigger episodes, consume caffeine in moderation. Stimulant drugs  Do not use stimulant drugs. If you need help quitting, talk with your health care provider. General instructions  Maintain a healthy weight.  Exercise regularly. Ask your health care provider to suggest some good activities for you. Aim for one or a combination of the following: ? 150 minutes per week of moderate exercise, such as  walking or yoga. ? 75 minutes per week of vigorous exercise, such as running or swimming.  Perform vagus nerve stimulation as directed by your health care provider.  Take over-the-counter and prescription medicines only as told by your health care provider. Contact a health care provider if:  You have episodes of SVT more often than before.  Episodes of SVT last longer than before.  Vagus nerve stimulation is no longer helping.  You have new symptoms. Get help right away if:  You have chest pain.  Your symptoms get worse.  You have trouble breathing.  You have an episode of SVT that lasts longer than 20 minutes.  You faint. These symptoms may represent a serious problem that is an emergency. Do not wait to see if the symptoms will go away. Get medical help right away. Call your local emergency services (911 in the [place] Do not drive yourself to the hospital. This information is not intended to replace advice given to you by your health care provider. Make sure you discuss any questions you have  with your health care provider. Document Released: 10/15/2005 Document Revised: 06/21/2016 Document Reviewed: 06/21/2016 Elsevier Interactive Patient Education  2017 [employer].

## 2017-09-11 NOTE — Assessment & Plan Note (Addendum)
Interestingly, she really did not notice the episodes that we saw on the monitor.  This is clearly what she was feeling however.  She and I would both like to avoid medications since his episodes are relatively short-lived and not overly symptomatic.  I explained the pathophysiology of SVT.  I do think some of it could be related to stress and therefore using the as needed Klonopin is reasonable.  To avoid medications, I discussed him different options for vagal maneuvers. --If she has significant recurrent symptoms, we could consider antiarrhythmic agents or beta blockers. --Also discussed importance of adequate hydration and avoiding triggers such as caffeine, nicotine, chocolate, stress etc.

## 2017-09-11 NOTE — Progress Notes (Signed)
PCP: Leighton Ruff, MD  Clinic Note: Chief Complaint  Patient presents with  . Follow-up    Echocardiogram and event monitor  . Tachycardia    HPI: Barbara Ellison is a 61 y.o. female who is being seen today for follow-up evaluation of Rapid heartbeat/palpitations and irregular EKG at the request of Leighton Ruff, MD. She has a history of multiple sclerosis and hyperlipidemia but no heart history. Her neurologist is through [facility] was seen in initial consultation back in August of this year for palpitations.  She has worn a 30-day event monitor that did show some episodes of SVT.   Recent Hospitalizations: None  Studies Personally Reviewed - (if available, images/films reviewed: From Epic Chart or Care Everywhere)  30-day event monitor October-November 2018: Mostly sinus rhythm with occasional sinus tachycardia.  Rare sinus bradycardia.  Average heart rate was 71 bpm.  Minimum heart rate 53 bpm.  Maximum _0 bpm and what appears to be SVT.  5 events of sinus tachycardia with PACs as well as 4 runs of roughly 1 minute PSVT.  Patient -did not note symptoms.  Noted noted symptoms with anything irregular was sinus rhythm with couplets of PVCs and a short atrial run.  Interval History: Barbara Ellison returns to discuss the results of her monitor and echocardiogram.  She says that she really did not feel any of the episodes noted on the event monitor, but did feel the one that was noted to be short PAT run.  She actually has not had as frequent episodes since I last saw her.  She has not had any lightheadedness or dizziness, syncope or near syncope. Otherwise cardiac review of symptoms is negative with exception of the intermittent episodes of fast heartbeats.  No chest tightness or pressure with rest or exertion.  No resting or exertional dyspnea.  No PND, orthopnea with minimal ankle edema that is worse in the summer times. Mild  fatigue which is consistent with routine findings for her. No TIA or amaurosis fugax symptoms.  No syncope or near syncope. No claudication.  ROS: A comprehensive was performed. Review of Systems  Constitutional: Positive for malaise/fatigue (Mild general fatigue, -baseline for her).  HENT: Negative for congestion and nosebleeds.   Respiratory: Negative for cough.   Gastrointestinal: Negative for blood in stool, constipation and melena.  Genitourinary: Negative for hematuria.  Musculoskeletal: Positive for joint pain (Mild arthritis pains).  Neurological: Positive for dizziness (Sometimes with her multiple sclerosis).       Has not had a recent multiple sclerosis flare  Psychiatric/Behavioral: Negative for depression. The patient is not nervous/anxious.   All other systems reviewed and are negative.  I have reviewed and (if needed) personally updated the patient's problem list, medications, allergies, past medical and surgical history, social and family history.   Past Medical History:  Diagnosis Date  . Anxiety   . Fracture of fifth metatarsal bone of right foot 04/08/2015  . History of abnormal cervical Pap smear   . History of adenomatous polyp of colon    2005 & 2010  . Left ureteral stone   . Multiple sclerosis ([place]) dx 2001 (approx.) relapsing-remitting   dr Raquel Sarna pharr--  [facility]  . Neuromuscular disorder ([place])    MS  . PSVT (paroxysmal supraventricular tachycardia) ([place]) 07/2017   PSVT noted on 30-day event monitor.  Several episodes of short bursts lasting less than 10 beats and up to 66 seconds.  Marland Kitchen  Urgency of urination     Past Surgical History:  Procedure Laterality Date  . COLONOSCOPY  last one 2015  . EXCISION SOFT TISSUE MASS, RIGHT CHEST WALL  02-26-2013  . OPEN REDUCTION INTERNAL FIXATION (ORIF) RIGHT METATARSAL (TOE) FRACTURE Right 04/08/2015   [facility]ia Bond[facility]  . TRANSTHORACIC ECHOCARDIOGRAM  12-24-2012   Ventricular  septum motion showed paradox,  ef 55-60%,   mild MR,  trivial PR and TR    Current Meds  Medication Sig  . atorvastatin (LIPITOR) 10 MG tablet Take 10 mg by mouth every morning.   . clonazePAM (KLONOPIN) 0.5 MG tablet Take 0.5 mg by mouth 3 (three) times daily as needed for anxiety (anxiety).   . Interferon Beta-1b (BETASERON) 0.3 MG KIT injection Inject 0.25 mg into the skin every other day. Qod  In the pm  . Multiple Vitamins-Minerals (WOMENS ONE DAILY PO) Take 1 tablet by mouth daily.  . vitamin E 400 UNIT capsule Take 400 Units by mouth daily.  Marland Kitchen zolpidem (AMBIEN CR) 12.5 MG CR tablet Take 12.5 mg by mouth at bedtime as needed for sleep (sleep).     No Known Allergies  Social History   Socioeconomic History  . Marital status: Married    Spouse name: Ayahna Solazzo  . Number of children: None  . Years of education: None  . Highest education level: None  Social Needs  . Financial resource strain: None  . Food insecurity - worry: None  . Food insecurity - inability: None  . Transportation needs - medical: None  . Transportation needs - non-medical: None  Occupational History    Employer: [employer]  Tobacco Use  . Smoking status: Never Smoker  . Smokeless tobacco: Never Used  Substance and Sexual Activity  . Alcohol use: Yes    Comment: 3-4 glasses of wine/week  . Drug use: No  . Sexual activity: None  Other Topics Concern  . None  Social History Narrative  . None    family history includes Cancer in her brother and mother; Diabetes in her brother and paternal aunt; Diabetes Mellitus II in her brother; Heart attack in her father; Heart disease (age of onset: 11) in her father; High Cholesterol in her brother and brother; Hypertension in her brother and brother; Stroke in her maternal aunt.  Wt Readings from Last 3 Encounters:  09/11/17 155 lb (70.3 kg)  06/18/17 161 lb (73 kg)  04/08/15 155 lb (70.3 kg)    PHYSICAL EXAM BP 116/80   Pulse 72   Ht 5'  6" (1.676 m)   Wt 155 lb (70.3 kg)   BMI 25.02 kg/m  Physical Exam  Constitutional: She is oriented to person, place, and time. She appears well-developed and well-nourished.  Well-groomed  HENT:  Head: Normocephalic and atraumatic.  Eyes: Conjunctivae and EOM are normal. Pupils are equal, round, and reactive to light.  Neck: No hepatojugular reflux and no JVD present. Carotid bruit is not present.  Cardiovascular: Normal rate, regular rhythm, normal heart sounds and intact distal pulses. Exam reveals no gallop and no friction rub.  No murmur heard. Pulmonary/Chest: Breath sounds normal. No respiratory distress. She has no wheezes. She has no rales.  Abdominal: Soft. Bowel sounds are normal. She exhibits no distension. There is no tenderness. There is no rebound.  Musculoskeletal: Normal range of motion. She exhibits no edema or deformity.  Neurological: She is alert and oriented to person, place, and time. No  cranial nerve deficit.  Skin: Skin is warm.  Psychiatric: She has a normal mood and affect. Her behavior is normal. Judgment and thought content normal.  Nursing note and vitals reviewed.    Adult ECG Report -Not checked   Other studies Reviewed: Additional studies/ records that were reviewed today include:   Recent Labs - no new   ASSESSMENT / PLAN: Problem List Items Addressed This Visit    Hypercholesterolemia (Chronic)    On statin.  Monitor by PCP.      PSVT (paroxysmal supraventricular tachycardia) ([place])    Interestingly, she really did not notice the episodes that we saw on the monitor.  This is clearly what she was feeling however.  She and I would both like to avoid medications since his episodes are relatively short-lived and not overly symptomatic.  I explained the pathophysiology of SVT.  I do think some of it could be related to stress and therefore using the as needed Klonopin is reasonable.  To avoid medications, I discussed him different options for  vagal maneuvers. --If she has significant recurrent symptoms, we could consider antiarrhythmic agents or beta blockers. --Also discussed importance of adequate hydration and avoiding triggers such as caffeine, nicotine, chocolate, stress etc.      Rapid palpitations - Primary    As expected, short bursts of PAT or PSVT.  Did not sound like A. fib.  She does have some PACs and PVCs but not prolonged episodes. Discussed vagal maneuvers.  We will avoid beta-blockers for now.         Current medicines are reviewed at length with the patient today. (+/- concerns) None The following changes have been made: None  Patient Instructions  NO CHANGES WITH MEDICATIONS   MONITOR SHOWED  CONDITION SVT ( SUPRAVENTRICULAR TACHYCARDIA)  Recommendations for vagal maneuvers:  "Bearing down"  Coughing  Gagging  Icy, cold towel on face or drink ice cold water   Your physician wants you to follow-up in [place]. You will receive a reminder letter in the mail two months in advance. If you don't receive a letter, please call our office to schedule the follow-up appointment.     Supraventricular Tachycardia, Adult Supraventricular tachycardia (SVT) is a type of abnormal heart rhythm. It causes the heart to beat very quickly and then return to a normal speed. A normal heart rate is 60-100 beats per minute. During an episode of SVT, your heart rate may be higher than 150 beats per minute. Episodes of SVT can be frightening, but they are usually not dangerous. However, if episodes happens often or last for long periods of time, they may lead to heart failure. What are the causes? Usually, a normal heartbeat starts when an area called the sinoatrial node releases an electrical signal. In SVT, other areas of the heart send out electrical signals that interfere with the signal from the sinoatrial node. It is not known why some people get SVT and others do not. What increases the  risk? This condition is more likely to develop in:  People who are 74?60 years old.  Women.  Factors that may increase your chances of an attack include:  Stress.  Tiredness.  Smoking.  Stimulant drugs, such as cocaine and methamphetamine.  Alcohol.  Caffeine.  Pregnancy.  Anxiety.  What are the signs or symptoms? Symptoms of this condition include:  A pounding heart.  A feeling that the heart is skipping beats (palpitations).  Weakness.  Shortness of breath.  Tightness or pain in your chest.  Light-headedness.  Anxiety.  Dizziness.  Sweating.  Nausea.  Fainting.  Fatigue or tiredness.  A mild episode may not cause symptoms. How is this diagnosed? This condition may be diagnosed based on:  Your symptoms.  A physical exam. If you are have an episode of SVT during the exam, the health care provider may be able to diagnose SVT by listening to your heart and feeling your pulse.  Tests. These may include: ? An electrocardiogram (ECG). This test is done to check for problems with electrical activity in the heart. ? A Holter monitor or event monitor test. This test involves wearing a portable device that monitors your heart rate over time. ? An echocardiogram. This test involves taking an image of your heart using sound waves. It is done to rule out other causes of a fast heart rate. ? Blood tests.  How is this treated? This condition may be treated with:  Vagal nerve stimulation. The treatment involves stimulating your vagus nerve, which slows down the heart. It is often the first and only treatment that is needed for this condition. It is a good idea to try the several ways of doing vagal stimulation to find which one works best for you. Ways to do this treatment include: ? Holding your breath and pushing, as though you are having a bowel movement. ? Massaging an area on one side of your neck, below your jaw. Do not try this yourself. Only a health  care provider should do this. If done the wrong way, it can lead to a stroke. ? Bending forward with your head between your legs. ? Coughing while bending forward with your head between your legs. ? Closing your eyes and massaging your eyeballs. A health care provider should guide you through this method before you try it on your own.  Medicines that prevent attacks.  Medicine to stop an attack. The medicine is given through an IV tube at the hospital.  A small electric shock (cardioversion) that stops an attack. Before you get the shock, you will get medicine to make you fall asleep.  Radiofrequency ablation. In this procedure, a small, thin tube (catheter) is used to send radiofrequency energy to the area of tissue that is causing the rapid heartbeats. The energy kills the cells and helps your heart keep a normal rhythm. You may have this treatment if you have symptoms of SVT often.  If you do not have symptoms, you may not need treatment. Follow these instructions at home: Stress  Avoid stressful situations when possible.  Find healthy ways of managing stress that work for you. Some healthy ways to manage stress include: ? Taking part in relaxing activities, such as yoga, meditation, or being out in nature. ? Listening to relaxing music. ? Practicing relaxation techniques, such as deep breathing. ? Leading a healthy lifestyle. This involves getting plenty of sleep, exercising, and eating a balanced diet. ? Attending counseling or talk therapy with a mental health professional. Sleep  Try to get at least 7 hours of sleep each night. Tobacco and nicotine  Do not use any products that contain nicotine or tobacco, such as cigarettes and e-cigarettes. If you need help quitting, ask your health care provider. Alcohol  If alcohol triggers episodes of SVT, do not drink alcohol.  If alcohol does not seem to trigger episodes, limit alcohol intake to no more than 1 drink a day for  nonpregnant women and 2 drinks a day  for men. One drink equals 12 oz of beer, 5 oz of wine, or 1 oz of hard liquor. Caffeine  If caffeine triggers episodes of SVT, do not eat, drink, or use anything with caffeine in it.  If caffeine does not seem to trigger episodes, consume caffeine in moderation. Stimulant drugs  Do not use stimulant drugs. If you need help quitting, talk with your health care provider. General instructions  Maintain a healthy weight.  Exercise regularly. Ask your health care provider to suggest some good activities for you. Aim for one or a combination of the following: ? 150 minutes per week of moderate exercise, such as walking or yoga. ? 75 minutes per week of vigorous exercise, such as running or swimming.  Perform vagus nerve stimulation as directed by your health care provider.  Take over-the-counter and prescription medicines only as told by your health care provider. Contact a health care provider if:  You have episodes of SVT more often than before.  Episodes of SVT last longer than before.  Vagus nerve stimulation is no longer helping.  You have new symptoms. Get help right away if:  You have chest pain.  Your symptoms get worse.  You have trouble breathing.  You have an episode of SVT that lasts longer than 20 minutes.  You faint. These symptoms may represent a serious problem that is an emergency. Do not wait to see if the symptoms will go away. Get medical help right away. Call your local emergency services (911 in the [place] Do not drive yourself to the hospital. This information is not intended to replace advice given to you by your health care provider. Make sure you discuss any questions you have with your health care provider. Document Released: 10/15/2005 Document Revised: 06/21/2016 Document Reviewed: 06/21/2016 Elsevier Interactive Patient Education  2017 [employer].     Studies Ordered:   No orders of the defined types  were placed in this encounter.     Glenetta Hew, M.D., M.S. Interventional Cardiologist   Pager # [phone] Phone # [phone] [address] [place], [place] [address]

## 2017-09-14 ENCOUNTER — Encounter: Payer: Self-pay | Admitting: Cardiology

## 2017-09-14 NOTE — Assessment & Plan Note (Signed)
As expected, short bursts of PAT or PSVT.  Did not sound like A. fib.  She does have some PACs and PVCs but not prolonged episodes. Discussed vagal maneuvers.  We will avoid beta-blockers for now.

## 2017-09-14 NOTE — Assessment & Plan Note (Signed)
On statin.  Monitor by PCP.

## 2017-10-16 DIAGNOSIS — G35 Multiple sclerosis: Secondary | ICD-10-CM | POA: Diagnosis not present

## 2017-10-16 DIAGNOSIS — R87619 Unspecified abnormal cytological findings in specimens from cervix uteri: Secondary | ICD-10-CM | POA: Diagnosis not present

## 2017-10-16 DIAGNOSIS — N952 Postmenopausal atrophic vaginitis: Secondary | ICD-10-CM | POA: Diagnosis not present

## 2017-10-16 DIAGNOSIS — N882 Stricture and stenosis of cervix uteri: Secondary | ICD-10-CM | POA: Diagnosis not present

## 2017-12-27 ENCOUNTER — Other Ambulatory Visit: Payer: Self-pay | Admitting: Obstetrics and Gynecology

## 2017-12-27 DIAGNOSIS — R87619 Unspecified abnormal cytological findings in specimens from cervix uteri: Secondary | ICD-10-CM | POA: Diagnosis not present

## 2018-01-02 LAB — CYTOLOGY - PAP

## 2018-01-09 DIAGNOSIS — N952 Postmenopausal atrophic vaginitis: Secondary | ICD-10-CM | POA: Diagnosis not present

## 2018-01-09 DIAGNOSIS — R87619 Unspecified abnormal cytological findings in specimens from cervix uteri: Secondary | ICD-10-CM | POA: Diagnosis not present

## 2018-04-04 DIAGNOSIS — Z0000 Encounter for general adult medical examination without abnormal findings: Secondary | ICD-10-CM | POA: Diagnosis not present

## 2018-04-04 DIAGNOSIS — E785 Hyperlipidemia, unspecified: Secondary | ICD-10-CM | POA: Diagnosis not present

## 2018-11-18 DIAGNOSIS — Z6824 Body mass index (BMI) 24.0-24.9, adult: Secondary | ICD-10-CM | POA: Diagnosis not present

## 2018-11-18 DIAGNOSIS — Z124 Encounter for screening for malignant neoplasm of cervix: Secondary | ICD-10-CM | POA: Diagnosis not present

## 2018-11-18 DIAGNOSIS — Z01419 Encounter for gynecological examination (general) (routine) without abnormal findings: Secondary | ICD-10-CM | POA: Diagnosis not present

## 2018-12-01 DIAGNOSIS — M5410 Radiculopathy, site unspecified: Secondary | ICD-10-CM | POA: Diagnosis not present

## 2018-12-01 DIAGNOSIS — G35 Multiple sclerosis: Secondary | ICD-10-CM | POA: Diagnosis not present

## 2018-12-01 DIAGNOSIS — Z79899 Other long term (current) drug therapy: Secondary | ICD-10-CM | POA: Diagnosis not present

## 2020-01-15 ENCOUNTER — Ambulatory Visit: Payer: 59 | Attending: Internal Medicine

## 2020-01-15 DIAGNOSIS — Z23 Encounter for immunization: Secondary | ICD-10-CM

## 2020-01-15 NOTE — Progress Notes (Signed)
   Covid-19 Vaccination Clinic  Name:  Barbara Ellison    MRN: [id number] DOB: 10-06-1956  01/15/2020  Barbara Ellison was observed post Covid-19 immunization for 15 minutes without incident. She was provided with Vaccine Information Sheet and instruction to access the V-Safe system.   Barbara Ellison was instructed to call 911 with any severe reactions post vaccine: Marland Kitchen Difficulty breathing  . Swelling of face and throat  . A fast heartbeat  . A bad rash all over body  . Dizziness and weakness   Immunizations Administered    Name Date Dose VIS Date Route   Pfizer COVID-19 Vaccine 01/15/2020  1:08 PM 0.3 mL 10/09/2019 Intramuscular   Manufacturer: [place]   Lot: [id number]   [place]: [id number]

## 2020-02-09 ENCOUNTER — Ambulatory Visit: Payer: 59 | Attending: Internal Medicine

## 2020-02-09 DIAGNOSIS — Z23 Encounter for immunization: Secondary | ICD-10-CM

## 2020-02-09 NOTE — Progress Notes (Signed)
   Covid-19 Vaccination Clinic  Name:  Barbara Ellison    MRN: [id number] DOB: [birth year]  02/09/2020  Barbara Ellison was observed post Covid-19 immunization for 15 minutes without incident. She was provided with Vaccine Information Sheet and instruction to access the V-Safe system.   Barbara Ellison was instructed to call 911 with any severe reactions post vaccine: Marland Kitchen Difficulty breathing  . Swelling of face and throat  . A fast heartbeat  . A bad rash all over body  . Dizziness and weakness   Immunizations Administered    Name Date Dose VIS Date Route   Pfizer COVID-19 Vaccine 02/09/2020  2:01 PM 0.3 mL 10/09/2019 Intramuscular   Manufacturer: [place]   Lot: [id number]   [place]: [id number]

## 2020-07-05 ENCOUNTER — Ambulatory Visit: Payer: 59 | Admitting: Neurology

## 2020-07-05 ENCOUNTER — Other Ambulatory Visit: Payer: Self-pay

## 2020-07-05 ENCOUNTER — Encounter: Payer: Self-pay | Admitting: Neurology

## 2020-07-05 VITALS — BP 130/82 | HR 76 | Ht 66.0 in | Wt 161.0 lb

## 2020-07-05 DIAGNOSIS — F418 Other specified anxiety disorders: Secondary | ICD-10-CM | POA: Diagnosis not present

## 2020-07-05 DIAGNOSIS — G35 Multiple sclerosis: Secondary | ICD-10-CM

## 2020-07-05 DIAGNOSIS — R29818 Other symptoms and signs involving the nervous system: Secondary | ICD-10-CM | POA: Diagnosis not present

## 2020-07-05 DIAGNOSIS — R3915 Urgency of urination: Secondary | ICD-10-CM

## 2020-07-05 MED ORDER — FLUOXETINE HCL 20 MG PO CAPS: 20.0000 mg | ORAL_CAPSULE | Freq: Every day | ORAL | 1 refills | Status: AC

## 2020-07-05 NOTE — Progress Notes (Signed)
[facility]  PATIENT: MALONE ADMIRE DOB: 08-28-1956  REFERRING DOCTOR OR PCP: Leighton Ruff, MD SOURCE: Patient, notes from Dr. Drema Dallas, notes from [facility] neurology, multiple imaging reports  _________________________________   HISTORICAL  CHIEF COMPLAINT:  Chief Complaint  Patient presents with  . New Patient (Initial Visit)    RM 12, alone. Paper referral from Dr. Drema Dallas for MS. Transferring care from [facility]. Wanted MD closer to home. On Betaseron. Has been on this since she was diagnosed with MS in her 27's. Has not been on any other DMT's.     HISTORY OF PRESENT ILLNESS:  I had the pleasure of seeing your patient, Nahomi Hegner, at the [facility] for neurologic consultation regarding her multiple sclerosis.  She is a 64 year old woman who was diagnosed with MS in 2004 by Dr. Erling Cruz.  At that time, she presented with numbness and tingling.  She had a Lhermitte sign when she bent her neck.  He had MRIs of the brain and cervical spine.  The MRI of the brain showed a couple T2/FLAIR hyperintense foci including 1 in the corpus callosum that enhanced after contrast.  The MRI of the cervical spine showed 3 lesions, more to the left.  She has not had any exacerbations since the initial presentation.  She was placed on Betaseron.  She continues on that medication and tolerates it well.  She started to see Dr. Dellis Filbert at [facility].  When he moved away she began to see Dr. Shelia Media.    Currently, she reports that she is mostly stable.  She has no difficulties with her gait.  She can go up and down stairs a few steps without using the banister.  She does not note any weakness or current numbness.  She does have urinary urgency and frequency and will have occasional incontinence.  She has been on oxybutynin in the past but is not currently taking it.   She has no problem with fatigue.   She sleeps well most nights.   She sleeps 5-7 hours a  night.    Mood is mostly good but occasionally she feels down or notes anxiety.     She notes reduced focus/attention and processing speed at times.     MRI of the brain 12/02/2013 showed a few periventricular and deep white matter white matter foci consistent with MS.  No enhancing lesions or change compared to 2012.  MRI of the brain 01/09/2011 showed a few periventricular and deep white matter foci consistent with MS.  MRI report of the brain 11/04/2002 described several white matter foci including 1 in the left corpus callosum that enhanced after contrast.  MRI report 11/04/2002 described foci within the spinal cord to the left at C3-C4, C4-C5 and C7-T1 they do not enhance.  REVIEW OF SYSTEMS: Constitutional: No fevers, chills, sweats, or change in appetite Eyes: No visual changes, double vision, eye pain Ear, nose and throat: No hearing loss, ear pain, nasal congestion, sore throat Cardiovascular: No chest pain, palpitations Respiratory: No shortness of breath at rest or with exertion.   No wheezes GastrointestinaI: No nausea, vomiting, diarrhea, abdominal pain, fecal incontinence Genitourinary: No dysuria, urinary retention or frequency.  No nocturia. Musculoskeletal: No neck pain, back pain Integumentary: No rash, pruritus, skin lesions Neurological: as above Psychiatric: No depression at this time.  No anxiety Endocrine: No palpitations, diaphoresis, change in appetite, change in weigh or increased thirst Hematologic/Lymphatic: No anemia, purpura, petechiae. Allergic/Immunologic: No itchy/runny eyes, nasal  congestion, recent allergic reactions, rashes  ALLERGIES: No Known Allergies  HOME MEDICATIONS:  Current Outpatient Medications:  .  atorvastatin (LIPITOR) 10 MG tablet, Take 10 mg by mouth every morning. , Disp: , Rfl:  .  clonazePAM (KLONOPIN) 0.5 MG tablet, Take 0.5 mg by mouth 3 (three) times daily as needed for anxiety (anxiety). , Disp: , Rfl:  .  Interferon Beta-1b  (BETASERON) 0.3 MG KIT injection, Inject 0.25 mg into the skin every other day. Qod  In the pm, Disp: , Rfl:  .  Multiple Vitamins-Minerals (WOMENS ONE DAILY PO), Take 1 tablet by mouth daily., Disp: , Rfl:  .  oxybutynin (DITROPAN) 5 MG tablet, Take 5 mg by mouth 3 (three) times daily as needed., Disp: , Rfl:  .  vitamin E 400 UNIT capsule, Take 400 Units by mouth daily., Disp: , Rfl:  .  zolpidem (AMBIEN CR) 12.5 MG CR tablet, Take 12.5 mg by mouth at bedtime as needed for sleep (sleep). , Disp: , Rfl:   PAST MEDICAL HISTORY: Past Medical History:  Diagnosis Date  . Anxiety   . Fracture of fifth metatarsal bone of right foot 04/08/2015  . History of abnormal cervical Pap smear   . History of adenomatous polyp of colon    2005 & 2010  . Left ureteral stone   . Multiple sclerosis ([place]) dx 2001 (approx.) relapsing-remitting   dr Raquel Sarna pharr--  [facility]  . Neuromuscular disorder ([place])    MS  . PSVT (paroxysmal supraventricular tachycardia) ([place]) 07/2017   PSVT noted on 30-day event monitor.  Several episodes of short bursts lasting less than 10 beats and up to 66 seconds.  . Urgency of urination     PAST SURGICAL HISTORY: Past Surgical History:  Procedure Laterality Date  . COLONOSCOPY  last one 2015  . EXCISION SOFT TISSUE MASS, RIGHT CHEST WALL  02-26-2013  . ORIF TOE FRACTURE Right 04/08/2015   Procedure: OPEN REDUCTION INTERNAL FIXATION (ORIF) RIGHT METATARSAL (TOE) FRACTURE;  Surgeon: Marchia Bond, MD;  Location: [facility];  Service: Orthopedics;  Laterality: Right;  . TRANSTHORACIC ECHOCARDIOGRAM  12-24-2012   Ventricular septum motion showed paradox,  ef 55-60%,   mild MR,  trivial PR and TR    FAMILY HISTORY: Family History  Problem Relation Age of Onset  . Cancer Mother        Uterine ca  . Heart disease Father 33       Fatal heart attack -> cardiac arrest  . Heart attack Father   . Hypertension Brother   . High Cholesterol Brother   . Diabetes Brother    . Cancer Brother   . Hypertension Brother   . Diabetes Mellitus II Brother   . High Cholesterol Brother   . Stroke Maternal Aunt   . Diabetes Paternal Aunt     SOCIAL HISTORY:  Social History   Socioeconomic History  . Marital status: Married    Spouse name: Glender Augusta  . Number of children: 0  . Years of education: Not on file  . Highest education level: Not on file  Occupational History  . 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 Smoking status: Never Smoker  . Smokeless tobacco: Never Used  Substance and Sexual Activity  . Alcohol use: Yes    Comment: 3-4 glasses of wine/week  . Drug use: No  . Sexual activity: Not on file  Other Topics Concern  . Not on file  Social History Narrative   Right handed   No caffeine use   Social Determinants of Radio broadcast assistant Strain:   . Difficulty of Paying Living Expenses: Not on file  Food Insecurity:   . Worried About Charity fundraiser in the Last Year: Not on file  . Ran Out of Food in the Last Year: Not on file  Transportation Needs:   . Lack of Transportation (Medical): Not on file  . Lack of Transportation (Non-Medical): Not on file  Physical Activity:   . Days of Exercise per Week: Not on file  . Minutes of Exercise per Session: Not on file  Stress:   . Feeling of Stress : Not on file  Social Connections:   . Frequency of Communication with Friends and Family: Not on file  . Frequency of Social Gatherings with Friends and Family: Not on file  . Attends Religious Services: Not on file  . Active Member of Clubs or Organizations: Not on file  . Attends Archivist Meetings: Not on file  . Marital Status: Not on file  Intimate Partner Violence:   . Fear of Current or Ex-Partner: Not on file  . Emotionally Abused: Not on file  . Physically Abused: Not on file  . Sexually Abused: Not on file     PHYSICAL EXAM  Vitals:   07/05/20 1323  BP: 130/82   Pulse: 76  Weight: 161 lb (73 kg)  Height: '5\' 6"'  (1.676 m)    Body mass index is 25.99 kg/m.   General: The patient is well-developed and well-nourished and in no acute distress  HEENT:  Head is [place]/AT.  Sclera are anicteric.  Funduscopic exam shows normal optic discs and retinal vessels.   Pharynx is Mallampati 2.     Neck: No carotid bruits are noted.  The neck is nontender.  Cardiovascular: The heart has a regular rate and rhythm with a normal S1 and S2. There were no murmurs, gallops or rubs.    Skin: Extremities are without rash or  edema.  Musculoskeletal:  Back is nontender  Neurologic Exam  Mental status: The patient is alert and oriented x 3 at the time of the examination. The patient has apparent normal recent and remote memory, with an apparently normal attention span and concentration ability.   Speech is normal.  Cranial nerves: Extraocular movements are full. Pupils are equal, round, and reactive to light and accomodation.  Color vision is symmetric.  Facial symmetry is present. There is good facial sensation to soft touch bilaterally.Facial strength is normal.  Trapezius and sternocleidomastoid strength is normal. No dysarthria is noted.  The tongue is midline, and the patient has symmetric elevation of the soft palate. No obvious hearing deficits are noted.  Motor:  Muscle bulk is normal.   Tone is normal. Strength is  5 / 5 in all 4 extremities.   Sensory: Sensory testing is intact to pinprick, soft touch and vibration sensation in all 4 extremities.  Coordination: Cerebellar testing reveals good finger-nose-finger and heel-to-shin bilaterally.  Gait and station: Station is normal.   Gait is normal. Tandem gait is slightly wide but likely normal for age. Romberg is negative.   Reflexes: Deep tendon reflexes are symmetric and normal bilaterally.   Plantar responses are flexor.      ASSESSMENT AND PLAN  Multiple sclerosis ([place]) - Plan: MR BRAIN WO CONTRAST,  MR CERVICAL SPINE WO CONTRAST  Lhermitte's sign positive - Plan: MR CERVICAL SPINE WO  CONTRAST  Urinary urgency - Plan: MR CERVICAL SPINE WO CONTRAST  Depression with anxiety   In summary, Ms. Lightle is a 64 year old woman who was diagnosed with MS in 2004 and has been very stable over the last 17 years on Betaseron.  For now, she will continue Betaseron.  We did discuss that some patients with MS or able to discontinue therapy in the 60s, especially those who has had stable disease, no changes on recent MRIs and a nonaggressive course.  We will check an MRI of the brain and cervical spine without contrast to further evaluate her MS and determine if she is having any subclinical progression.  If she is having subclinical progression, consider a switch from Betaseron to a stronger medication.  If this is not occurring, she will continue Betaseron or we could consider a trial off medication with follow-up MRIs annually for a few years.  The MRI will be compared to the 2015 MRI.  She does report some difficulty with focus/attention.  We could consider a trial of provigil/Nuvigil.  If that is ineffective we could consider a trial of a stimulant such as Ritalin or Adderall.  I will renew her fluoxetine.  She will return to see me in 6 months or sooner if there are new or worsening neurologic symptoms.    Adreanne Yono A. Felecia Shelling, MD, [facility] 5/0/5697, 9:48 PM Certified in Neurology, Clinical Neurophysiology, Sleep Medicine and Neuroimaging  [facility] [address] [place], [place] [address] [phone]

## 2020-07-06 ENCOUNTER — Telehealth: Payer: Self-pay | Admitting: Neurology

## 2020-07-06 NOTE — Telephone Encounter (Signed)
no to the covid questions MR Brain wo contrast & MR Cervical spine wo contrast Dr. Felecia Shelling U[employer] Auth: [place] via uhc website. Patient is scheduled at [facility] for 07/27/20.

## 2020-07-27 ENCOUNTER — Ambulatory Visit (INDEPENDENT_AMBULATORY_CARE_PROVIDER_SITE_OTHER): Payer: 59

## 2020-07-27 ENCOUNTER — Other Ambulatory Visit: Payer: Self-pay

## 2020-07-27 DIAGNOSIS — R29818 Other symptoms and signs involving the nervous system: Secondary | ICD-10-CM | POA: Diagnosis not present

## 2020-07-27 DIAGNOSIS — G35 Multiple sclerosis: Secondary | ICD-10-CM | POA: Diagnosis not present

## 2020-07-27 DIAGNOSIS — R3915 Urgency of urination: Secondary | ICD-10-CM

## 2020-08-02 ENCOUNTER — Other Ambulatory Visit: Payer: Self-pay

## 2020-08-02 ENCOUNTER — Encounter: Payer: Self-pay | Admitting: Plastic Surgery

## 2020-08-02 ENCOUNTER — Ambulatory Visit (INDEPENDENT_AMBULATORY_CARE_PROVIDER_SITE_OTHER): Payer: 59 | Admitting: Plastic Surgery

## 2020-08-02 VITALS — BP 114/72 | HR 77 | Temp 98.3°F | Ht 66.0 in | Wt 161.0 lb

## 2020-08-02 DIAGNOSIS — G8929 Other chronic pain: Secondary | ICD-10-CM

## 2020-08-02 DIAGNOSIS — N62 Hypertrophy of breast: Secondary | ICD-10-CM

## 2020-08-02 DIAGNOSIS — G35 Multiple sclerosis: Secondary | ICD-10-CM | POA: Diagnosis not present

## 2020-08-02 DIAGNOSIS — M542 Cervicalgia: Secondary | ICD-10-CM | POA: Diagnosis not present

## 2020-08-02 DIAGNOSIS — M549 Dorsalgia, unspecified: Secondary | ICD-10-CM

## 2020-08-02 DIAGNOSIS — I471 Supraventricular tachycardia: Secondary | ICD-10-CM | POA: Diagnosis not present

## 2020-08-02 DIAGNOSIS — M546 Pain in thoracic spine: Secondary | ICD-10-CM

## 2020-08-02 NOTE — Progress Notes (Signed)
Patient ID: Barbara Ellison, female    DOB: [birth year], 64 y.o.   MRN: [id number]   Chief Complaint  Patient presents with  . Consult  . Breast Problem    Mammary Hyperplasia: The patient is a 64 y.o. female with a history of mammary hyperplasia for several years.  She has extremely large breasts causing symptoms that include the following: Back pain in the upper and lower back, including neck pain. She pulls or pins her bra straps to provide better lift and relief of the pressure and pain. She notices relief by holding her breast up manually.  Her shoulder straps cause grooves and pain and pressure that requires padding for relief. Pain medication is sometimes required with motrin and tylenol.  Activities that are hindered by enlarged breasts include: exercise and running.  She has tried supportive clothing as well as fitted bras without improvement.  Her breasts are extremely large and fairly symmetric.  She has hyperpigmentation of the inframammary area on both sides.  The sternal to nipple distance on the right is 32 cm and the left is 31 cm.  The IMF distance is 16 cm.  She is 5 feet 6 inches tall and weighs 161 pounds.  Preoperative bra size = 38 DDD cup.  She would like to be around a B/C cup the estimated excess breast tissue to be removed at the time of surgery = 455 grams on the left and 455 grams on the right.  Mammogram history: Her last mammogram was October 2020 and was negative.  She is due for another.  Family history of breast cancer: No family history of breast cancer.  Tobacco use: No tobacco use.  She is prediabetic and her last hemoglobin A1c was 6.2 last month.  She has grade 3 ptosis bilaterally.   Review of Systems  Constitutional: Positive for activity change. Negative for appetite change.  HENT: Negative.   Eyes: Negative.   Respiratory: Negative.  Negative for chest tightness and shortness of breath.   Cardiovascular: Negative.  Negative for leg swelling.    Endocrine: Negative.   Genitourinary: Negative.   Musculoskeletal: Positive for back pain and neck pain.  Hematological: Negative.   Psychiatric/Behavioral: Negative.     Past Medical History:  Diagnosis Date  . Anxiety   . Fracture of fifth metatarsal bone of right foot 04/08/2015  . History of abnormal cervical Pap smear   . History of adenomatous polyp of colon    2005 & 2010  . Left ureteral stone   . Multiple sclerosis ([place]) dx 2001 (approx.) relapsing-remitting   dr Raquel Sarna pharr--  [facility]  . Neuromuscular disorder ([place])    MS  . PSVT (paroxysmal supraventricular tachycardia) ([place]) 07/2017   PSVT noted on 30-day event monitor.  Several episodes of short bursts lasting less than 10 beats and up to 66 seconds.  . Urgency of urination     Past Surgical History:  Procedure Laterality Date  . COLONOSCOPY  last one 2015  . EXCISION SOFT TISSUE MASS, RIGHT CHEST WALL  02-26-2013  . ORIF TOE FRACTURE Right 04/08/2015   Procedure: OPEN REDUCTION INTERNAL FIXATION (ORIF) RIGHT METATARSAL (TOE) FRACTURE;  Surgeon: Marchia Bond, MD;  Location: [facility];  Service: Orthopedics;  Laterality: Right;  . TRANSTHORACIC ECHOCARDIOGRAM  12-24-2012   Ventricular septum motion showed paradox,  ef 55-60%,   mild MR,  trivial PR and TR      Current Outpatient Medications:  .  ALPRAZolam Duanne Moron)  0.25 MG tablet, Take 0.25 mg by mouth as needed for anxiety., Disp: , Rfl:  .  atorvastatin (LIPITOR) 10 MG tablet, Take 10 mg by mouth every morning. , Disp: , Rfl:  .  FLUoxetine (PROZAC) 20 MG capsule, Take 1 capsule (20 mg total) by mouth daily., Disp: 90 capsule, Rfl: 1 .  Interferon Beta-1b (BETASERON) 0.3 MG KIT injection, Inject 0.25 mg into the skin every other day. Qod  In the pm, Disp: , Rfl:  .  Multiple Vitamins-Minerals (WOMENS ONE DAILY PO), Take 1 tablet by mouth daily., Disp: , Rfl:  .  vitamin E 400 UNIT capsule, Take 400 Units by mouth daily., Disp: , Rfl:  .  zolpidem  (AMBIEN CR) 12.5 MG CR tablet, Take 12.5 mg by mouth at bedtime as needed for sleep (sleep). , Disp: , Rfl:    Objective:   Vitals:   08/02/20 1420  BP: 114/72  Pulse: 77  Temp: 98.3 F (36.8 C)  SpO2: 98%    Physical Exam Vitals and nursing note reviewed.  Constitutional:      Appearance: Normal appearance.  HENT:     Head: Normocephalic and atraumatic.  Cardiovascular:     Rate and Rhythm: Normal rate.     Pulses: Normal pulses.  Pulmonary:     Effort: Pulmonary effort is normal. No respiratory distress.  Abdominal:     General: Abdomen is flat. There is no distension.  Neurological:     General: No focal deficit present.     Mental Status: She is alert and oriented to person, place, and time.  Psychiatric:        Mood and Affect: Mood normal.        Behavior: Behavior normal.     Assessment & Plan:  Multiple sclerosis (HCC)  PSVT (paroxysmal supraventricular tachycardia) (HCC)  Discomfort of back  Neck pain  Chronic bilateral thoracic back pain  Symptomatic mammary hypertrophy  We discussed the risks and complications which include change in nipple areola sensation, poor wound healing due to the size and her diabetes, scars, possible need for biopsy if there is a change in a mammogram result.  Patient is a good candidate for bilateral breast reduction with possible liposuction. I have referred her to physical therapy and for mammogram.  She will call the office when she has completed both.  Pictures were obtained of the patient and placed in the chart with the patient's or guardian's permission.  [place], DO

## 2020-08-18 ENCOUNTER — Ambulatory Visit: Payer: 59 | Attending: Plastic Surgery

## 2020-08-18 ENCOUNTER — Other Ambulatory Visit: Payer: Self-pay

## 2020-08-18 DIAGNOSIS — M6281 Muscle weakness (generalized): Secondary | ICD-10-CM | POA: Diagnosis present

## 2020-08-18 DIAGNOSIS — G8929 Other chronic pain: Secondary | ICD-10-CM | POA: Diagnosis present

## 2020-08-18 DIAGNOSIS — R293 Abnormal posture: Secondary | ICD-10-CM

## 2020-08-18 DIAGNOSIS — N62 Hypertrophy of breast: Secondary | ICD-10-CM | POA: Diagnosis not present

## 2020-08-18 DIAGNOSIS — M545 Low back pain, unspecified: Secondary | ICD-10-CM | POA: Insufficient documentation

## 2020-08-18 DIAGNOSIS — M542 Cervicalgia: Secondary | ICD-10-CM | POA: Diagnosis present

## 2020-08-18 NOTE — Therapy (Signed)
[place], [place], [address] Phone: [phone]   Fax:  [phone]  Physical Therapy Evaluation  Patient Details  Name: Barbara Ellison MRN: [id number] Date of Birth: 1955/11/01 Referring Provider (PT): Audelia Hives, DO   Encounter Date: 08/18/2020   PT End of Session - 08/18/20 1610    Visit Number 1    Number of Visits 6    Authorization Type UHC Medicare    PT Start Time 3235    PT Stop Time 1500    PT Time Calculation (min) 45 min    Activity Tolerance Patient tolerated treatment well    Behavior During Therapy [facility] for tasks assessed/performed           Past Medical History:  Diagnosis Date  . Anxiety   . Fracture of fifth metatarsal bone of right foot 04/08/2015  . History of abnormal cervical Pap smear   . History of adenomatous polyp of colon    2005 & 2010  . Left ureteral stone   . Multiple sclerosis ([place]) dx 2001 (approx.) relapsing-remitting   dr Raquel Sarna pharr--  WF  . Neuromuscular disorder ([place])    MS  . PSVT (paroxysmal supraventricular tachycardia) ([place]) 07/2017   PSVT noted on 30-day event monitor.  Several episodes of short bursts lasting less than 10 beats and up to 66 seconds.  . Urgency of urination     Past Surgical History:  Procedure Laterality Date  . COLONOSCOPY  last one 2015  . EXCISION SOFT TISSUE MASS, RIGHT CHEST WALL  02-26-2013  . ORIF TOE FRACTURE Right 04/08/2015   Procedure: OPEN REDUCTION INTERNAL FIXATION (ORIF) RIGHT METATARSAL (TOE) FRACTURE;  Surgeon: Marchia Bond, MD;  Location: [place];  Service: Orthopedics;  Laterality: Right;  . TRANSTHORACIC ECHOCARDIOGRAM  12-24-2012   Ventricular septum motion showed paradox,  ef 55-60%,   mild MR,  trivial PR and TR    There were no vitals filed for this visit.    Subjective Assessment - 08/18/20 1426    Subjective Pt reports having back and neck pain for a long time, secondary to  enlarged breasts. She finally got tired of the pain and saw Dr. Marla Roe.    Limitations Sitting;House hold activities    Patient Stated Goals to improve posture and decrease pain    Currently in Pain? Yes    Pain Score 0-No pain    Pain Location Neck    Pain Orientation Medial;Lower    Pain Descriptors / Indicators Aching    Pain Type Chronic pain    Pain Onset More than a month ago    Pain Frequency Intermittent    Aggravating Factors  looking down, poor posture    Pain Relieving Factors improved posture    Multiple Pain Sites Yes    Pain Score 0    Pain Location Back    Pain Orientation Lower;Mid;Upper;Medial    Pain Descriptors / Indicators Aching    Pain Type Chronic pain    Pain Onset More than a month ago    Pain Frequency Intermittent    Aggravating Factors  unknown              [facility] PT Assessment - 08/18/20 0001      Assessment   Medical Diagnosis Neck and Back Pain, Symptomatic Mammary Hypertrophy    Referring Provider (PT) Audelia Hives, DO    Onset Date/Surgical Date --   unknown   Hand Dominance Right  Next MD Visit not scheduled yet    Prior Therapy For foot and sciatica      Balance Screen   Has the patient fallen in the past 6 months No    Has the patient had a decrease in activity level because of a fear of falling?  No    Is the patient reluctant to leave their home because of a fear of falling?  No      Home Environment   Living Environment Private residence    Living Arrangements Spouse/significant other    Type of [place] to enter    Home Layout Two level    Alternate Level Stairs-Number of Steps 12    Alternate Level Stairs-Rails Right      Prior Function   Level of Independence Independent    Vocation Retired    Leisure Going out to eat, travel, family time, exercise      ROM / Strength   AROM / PROM / Strength AROM      AROM   AROM Assessment Site Cervical;Lumbar    Cervical Flexion 60    Cervical  Extension 35    Cervical - Right Side Bend 30    Cervical - Left Side Bend 30    Cervical - Right Rotation 70    Cervical - Left Rotation 72    Lumbar Flexion 50   pain   Lumbar Extension 15   pain   Lumbar - Right Side Bend 12    Lumbar - Left Side Bend 15    Lumbar - Right Rotation WFL    Lumbar - Left Rotation WFL      Palpation   Spinal mobility hypomobile T/S greater than L/S    Palpation comment TTP L3, mid thoracic                      Objective measurements completed on examination: See above findings.               PT Education - 08/18/20 1615    Education Details Diagnosis, POC, HEP, postur    Person(s) Educated Patient    Methods Explanation;Demonstration;Tactile cues;Verbal cues;Handout    Comprehension Verbal cues required;Returned demonstration;Verbalized understanding;Tactile cues required            PT Short Term Goals - 08/18/20 1620      PT SHORT TERM GOAL #1   Title Pt will be I and compliant with initial HEP.    Time 2    Period Weeks    Status New    Target Date 09/01/20             PT Long Term Goals - 08/18/20 1621      PT LONG TERM GOAL #1   Title Pt will be I with long term HEP + progressions for maintenance and continued postural alignment .    Time 6    Period Weeks    Status New    Target Date 09/29/20      PT LONG TERM GOAL #2   Title Pt will decrease neck and back pain by 50% at its worst.    Time 6    Period Weeks    Status New    Target Date 09/29/20      PT LONG TERM GOAL #3   Title Pt will report minimal discomfort with lumbar AROM and demo WFL.    Time 6  Period Weeks    Status New    Target Date 09/29/20      PT LONG TERM GOAL #4   Title Pt will demo cervical AROM WFL with no pain.    Time 6    Period Weeks    Status New    Target Date 09/29/20                  Plan - 08/18/20 1611    Clinical Impression Statement Pt is a 64 yo female who presents with symptomatic mammary  hypertrophy and subsequent neck and back pain. Pt demonstrates impairments in posture with forward head, increased thoracic kyphosis, and forward rounding of shoulders. She has poor tolerance for prone lying secondary to LBP and is hypomobile throughout T/S and L/S. Pt reported pain with all lumbar AROM and was most limited into extension and lateral flexion B R>L. Pt has significant mobility into cervical flexion, but poor ability to extend. She was educated on benefits of physical therapy, diagnosis, prognosis, POC, and HEP. She will benefit from 6 skilled PT sessions including evaluation 1x/week for 6 weeks to address impairments.    Personal Factors and Comorbidities Age;Time since onset of injury/illness/exacerbation    Examination-Activity Limitations Sit;Lift;Bend    Examination-Participation Restrictions Interpersonal Relationship;Community Activity    Stability/Clinical Decision Making Stable/Uncomplicated    Clinical Decision Making Low    Rehab Potential Fair    PT Frequency 1x / week    PT Duration 6 weeks    PT Treatment/Interventions ADLs/Self Care Home Management;Spinal Manipulations;Joint Manipulations;Dry needling;Taping;Passive range of motion;Manual techniques;Patient/family education;Neuromuscular re-education;Therapeutic exercise;Functional mobility training;Therapeutic activities;Moist Heat;Cryotherapy    PT Next Visit Plan Assess response to HEP, progress spinal mobility, postural mechanics and alignments    PT Home Exercise Plan [email or web address]: S/L book openers, seated T/S ext, standing chin tucks at wall, scap retracts    Consulted and Agree with Plan of Care Patient           Patient will benefit from skilled therapeutic intervention in order to improve the following deficits and impairments:  Hypomobility, Pain, Postural dysfunction, Impaired flexibility, Increased fascial restricitons, Improper body mechanics  Visit Diagnosis: Symptomatic mammary hypertrophy - Plan: PT plan  of care cert/re-cert  Neck pain - Plan: PT plan of care cert/re-cert  Chronic bilateral low back pain without sciatica - Plan: PT plan of care cert/re-cert  Abnormal posture - Plan: PT plan of care cert/re-cert  Muscle weakness (generalized) - Plan: PT plan of care cert/re-cert     Problem List Patient Active Problem List   Diagnosis Date Noted  . Neck pain 08/02/2020  . Back pain 08/02/2020  . Symptomatic mammary hypertrophy 08/02/2020  . PSVT (paroxysmal supraventricular tachycardia) ([place]) 09/11/2017  . Rapid palpitations 06/18/2017  . Fracture of fifth metatarsal bone of right foot 04/08/2015  . Ankle edema 08/14/2013  . Hypercholesterolemia 11/25/2012  . Multiple sclerosis ([place]) 11/25/2012  . Anxiety 11/25/2012  . Lipoma 11/25/2012  . Discomfort of back 11/25/2012  . Hx of adenomatous colonic polyps 11/25/2012  . H/O constipation     Izell [place], PT, DPT 08/18/2020, 4:27 PM  [facility] [address] [place], [place], [address] Phone: [phone]   Fax:  [phone]  Name: Barbara Ellison MRN: [id number] Date of Birth: 05-21-1956

## 2020-08-18 NOTE — Patient Instructions (Signed)
Access Code: [email or web address] URL: [email or web address] Date: 08/18/2020 Prepared by: Kathreen Cornfield  Exercises Sidelying Thoracic and Shoulder Rotation - 1-2 x daily - 7 x weekly - 2 sets - 10 reps Seated Thoracic Lumbar Extension with Pectoralis Stretch - 1 x daily - 7 x weekly - 2 sets - 10 reps Seated Scapular Retraction - 1 x daily - 7 x weekly - 2 sets - 10 reps - 5 seconds hold Cervical Retraction at Wall - 3-5 x daily - 7 x weekly - 2 sets - 10 reps - 5 seconds hold

## 2020-08-23 ENCOUNTER — Ambulatory Visit: Payer: 59 | Admitting: Physical Therapy

## 2020-08-23 ENCOUNTER — Encounter: Payer: Self-pay | Admitting: Physical Therapy

## 2020-08-23 ENCOUNTER — Other Ambulatory Visit: Payer: Self-pay

## 2020-08-23 DIAGNOSIS — M6281 Muscle weakness (generalized): Secondary | ICD-10-CM

## 2020-08-23 DIAGNOSIS — N62 Hypertrophy of breast: Secondary | ICD-10-CM | POA: Diagnosis not present

## 2020-08-23 DIAGNOSIS — G8929 Other chronic pain: Secondary | ICD-10-CM

## 2020-08-23 DIAGNOSIS — R293 Abnormal posture: Secondary | ICD-10-CM

## 2020-08-23 DIAGNOSIS — M542 Cervicalgia: Secondary | ICD-10-CM

## 2020-08-23 DIAGNOSIS — M545 Low back pain, unspecified: Secondary | ICD-10-CM

## 2020-08-23 NOTE — Therapy (Signed)
[place], [place], [address] Phone: [phone]   Fax:  [phone]  Physical Therapy Treatment  Patient Details  Name: GUISELLE MIAN MRN: [id number] Date of Birth: 1956-09-17 Referring Provider (PT): Audelia Hives, DO   Encounter Date: 08/23/2020   PT End of Session - 08/23/20 1548    Visit Number 2    Number of Visits 6    Authorization Type UHC Medicare    PT Start Time 1941   pt arrived late   PT Stop Time 1627    PT Time Calculation (min) 39 min    Activity Tolerance Patient tolerated treatment well    Behavior During Therapy [facility] for tasks assessed/performed           Past Medical History:  Diagnosis Date  . Anxiety   . Fracture of fifth metatarsal bone of right foot 04/08/2015  . History of abnormal cervical Pap smear   . History of adenomatous polyp of colon    2005 & 2010  . Left ureteral stone   . Multiple sclerosis ([place]) dx 2001 (approx.) relapsing-remitting   dr Raquel Sarna pharr--  WF  . Neuromuscular disorder ([place])    MS  . PSVT (paroxysmal supraventricular tachycardia) ([place]) 07/2017   PSVT noted on 30-day event monitor.  Several episodes of short bursts lasting less than 10 beats and up to 66 seconds.  . Urgency of urination     Past Surgical History:  Procedure Laterality Date  . COLONOSCOPY  last one 2015  . EXCISION SOFT TISSUE MASS, RIGHT CHEST WALL  02-26-2013  . ORIF TOE FRACTURE Right 04/08/2015   Procedure: OPEN REDUCTION INTERNAL FIXATION (ORIF) RIGHT METATARSAL (TOE) FRACTURE;  Surgeon: Marchia Bond, MD;  Location: [place];  Service: Orthopedics;  Laterality: Right;  . TRANSTHORACIC ECHOCARDIOGRAM  12-24-2012   Ventricular septum motion showed paradox,  ef 55-60%,   mild MR,  trivial PR and TR    There were no vitals filed for this visit.   Subjective Assessment - 08/23/20 1550    Subjective There is always discomfort.    Patient Stated Goals  to improve posture and decrease pain    Currently in Pain? Yes    Pain Score 5     Pain Location Back    Pain Orientation Upper    Pain Descriptors / Indicators Discomfort                             [facility] Adult PT Treatment/Exercise - 08/23/20 0001      Exercises   Exercises Shoulder      Shoulder Exercises: Supine   Theraband Level (Shoulder Horizontal ABduction) Level 3 (Green)    Horizontal ABduction Limitations with cues for ab set    Other Supine Exercises ab set      Shoulder Exercises: Prone   Other Prone Exercises qped lawn mower green tband, cues for ab set      Shoulder Exercises: ROM/Strengthening   UBE (Upper Arm Bike) 2/2 L1      Shoulder Exercises: Stretch   Other Shoulder Stretches supine chest stretch    Other Shoulder Stretches cat/camel/child pose      Manual Therapy   Manual Therapy Soft tissue mobilization    Soft tissue mobilization bil upper traps, pectoralis, suboccipitals                    PT Short Term  Goals - 08/18/20 1620      PT SHORT TERM GOAL #1   Title Pt will be I and compliant with initial HEP.    Time 2    Period Weeks    Status New    Target Date 09/01/20             PT Long Term Goals - 08/18/20 1621      PT LONG TERM GOAL #1   Title Pt will be I with long term HEP + progressions for maintenance and continued postural alignment .    Time 6    Period Weeks    Status New    Target Date 09/29/20      PT LONG TERM GOAL #2   Title Pt will decrease neck and back pain by 50% at its worst.    Time 6    Period Weeks    Status New    Target Date 09/29/20      PT LONG TERM GOAL #3   Title Pt will report minimal discomfort with lumbar AROM and demo WFL.    Time 6    Period Weeks    Status New    Target Date 09/29/20      PT LONG TERM GOAL #4   Title Pt will demo cervical AROM WFL with no pain.    Time 6    Period Weeks    Status New    Target Date 09/29/20                 Plan  - 08/23/20 1627    Clinical Impression Statement Began adding resistance to strengthening exercises today with focus on full chain posture through core. Pt reported feeling pretty good after treatment. Encouraged her to feel a small activation for scapular retraction throughout her day rather than a big squeeze to improve endurance for posture.    PT Treatment/Interventions ADLs/Self Care Home Management;Spinal Manipulations;Joint Manipulations;Dry needling;Taping;Passive range of motion;Manual techniques;Patient/family education;Neuromuscular re-education;Therapeutic exercise;Functional mobility training;Therapeutic activities;Moist Heat;Cryotherapy    PT Next Visit Plan cont postural strengthening, increase core challenges    PT Home Exercise Plan [email or web address]: S/L book openers, seated T/S ext, standing chin tucks at wall, scap retracts    Consulted and Agree with Plan of Care Patient           Patient will benefit from skilled therapeutic intervention in order to improve the following deficits and impairments:  Hypomobility, Pain, Postural dysfunction, Impaired flexibility, Increased fascial restricitons, Improper body mechanics  Visit Diagnosis: Symptomatic mammary hypertrophy  Neck pain  Chronic bilateral low back pain without sciatica  Abnormal posture  Muscle weakness (generalized)     Problem List Patient Active Problem List   Diagnosis Date Noted  . Neck pain 08/02/2020  . Back pain 08/02/2020  . Symptomatic mammary hypertrophy 08/02/2020  . PSVT (paroxysmal supraventricular tachycardia) ([place]) 09/11/2017  . Rapid palpitations 06/18/2017  . Fracture of fifth metatarsal bone of right foot 04/08/2015  . Ankle edema 08/14/2013  . Hypercholesterolemia 11/25/2012  . Multiple sclerosis ([place]) 11/25/2012  . Anxiety 11/25/2012  . Lipoma 11/25/2012  . Discomfort of back 11/25/2012  . Hx of adenomatous colonic polyps 11/25/2012  . H/O constipation     [place] Dona C. [name] PT,  DPT 08/23/20 4:29 PM   [place] [facility] [address] [place], [place], [address] Phone: [phone]   Fax:  [phone]  Name: VIRGIE KUNDA MRN: [id number] Date of Birth: 1956-08-16

## 2020-09-05 ENCOUNTER — Ambulatory Visit: Payer: 59 | Attending: Internal Medicine

## 2020-09-05 DIAGNOSIS — Z23 Encounter for immunization: Secondary | ICD-10-CM

## 2020-09-05 NOTE — Progress Notes (Signed)
   Covid-19 Vaccination Clinic  Name:  Barbara Ellison    MRN: [id number] DOB: 1956/10/22  09/05/2020  [name] was observed post Covid-19 immunization for 15 minutes without incident. She was provided with Vaccine Information Sheet and instruction to access the V-Safe system.   [name] was instructed to call 911 with any severe reactions post vaccine: Marland Kitchen Difficulty breathing  . Swelling of face and throat  . A fast heartbeat  . A bad rash all over body  . Dizziness and weakness

## 2020-09-06 ENCOUNTER — Other Ambulatory Visit: Payer: Self-pay

## 2020-09-06 ENCOUNTER — Ambulatory Visit: Payer: 59 | Attending: Plastic Surgery

## 2020-09-06 DIAGNOSIS — M542 Cervicalgia: Secondary | ICD-10-CM | POA: Insufficient documentation

## 2020-09-06 DIAGNOSIS — G8929 Other chronic pain: Secondary | ICD-10-CM | POA: Insufficient documentation

## 2020-09-06 DIAGNOSIS — M545 Low back pain, unspecified: Secondary | ICD-10-CM | POA: Diagnosis present

## 2020-09-06 DIAGNOSIS — M6281 Muscle weakness (generalized): Secondary | ICD-10-CM | POA: Insufficient documentation

## 2020-09-06 DIAGNOSIS — R293 Abnormal posture: Secondary | ICD-10-CM | POA: Diagnosis present

## 2020-09-06 DIAGNOSIS — N62 Hypertrophy of breast: Secondary | ICD-10-CM

## 2020-09-06 NOTE — Therapy (Signed)
[place], [place], [address] Phone: [phone]   Fax:  [phone]  Physical Therapy Treatment  Patient Details  Name: Barbara Ellison MRN: [id number] Date of Birth: 14-Feb-1956 Referring Provider (PT): Audelia Hives, DO   Encounter Date: 09/06/2020   PT End of Session - 09/06/20 1506    Visit Number 3    Number of Visits 6    Authorization Type UHC Medicare    PT Start Time 1502    PT Stop Time 1544    PT Time Calculation (min) 42 min    Activity Tolerance Patient tolerated treatment well    Behavior During Therapy [facility] for tasks assessed/performed           Past Medical History:  Diagnosis Date  . Anxiety   . Fracture of fifth metatarsal bone of right foot 04/08/2015  . History of abnormal cervical Pap smear   . History of adenomatous polyp of colon    2005 & 2010  . Left ureteral stone   . Multiple sclerosis ([place]) dx 2001 (approx.) relapsing-remitting   dr Raquel Sarna pharr--  WF  . Neuromuscular disorder ([place])    MS  . PSVT (paroxysmal supraventricular tachycardia) ([place]) 07/2017   PSVT noted on 30-day event monitor.  Several episodes of short bursts lasting less than 10 beats and up to 66 seconds.  . Urgency of urination     Past Surgical History:  Procedure Laterality Date  . COLONOSCOPY  last one 2015  . EXCISION SOFT TISSUE MASS, RIGHT CHEST WALL  02-26-2013  . ORIF TOE FRACTURE Right 04/08/2015   Procedure: OPEN REDUCTION INTERNAL FIXATION (ORIF) RIGHT METATARSAL (TOE) FRACTURE;  Surgeon: Marchia Bond, MD;  Location: [facility];  Service: Orthopedics;  Laterality: Right;  . TRANSTHORACIC ECHOCARDIOGRAM  12-24-2012   Ventricular septum motion showed paradox,  ef 55-60%,   mild MR,  trivial PR and TR    There were no vitals filed for this visit.   Subjective Assessment - 09/06/20 1505    Subjective Pt reports having her booster yesterday with some soreness in her arm  today.    Patient Stated Goals to improve posture and decrease pain    Currently in Pain? No/denies                             [facility] Adult PT Treatment/Exercise - 09/06/20 0001      Shoulder Exercises: Supine   Other Supine Exercises T/S ext 2 x10, Pec S series vertical on the FR, spiderman arcs, scissors      Shoulder Exercises: Standing   Extension Strengthening;Both;10 reps;Theraband    Theraband Level (Shoulder Extension) Level 2 (Red)    Row Strengthening;Both;10 reps;Theraband    Theraband Level (Shoulder Row) Level 3 (Green)      Shoulder Exercises: ROM/Strengthening   UBE (Upper Arm Bike) 3/3 L2      Shoulder Exercises: Stretch   Other Shoulder Stretches child's pose 3 way with OP to lumbopelvic region                    PT Short Term Goals - 08/18/20 1620      PT SHORT TERM GOAL #1   Title Pt will be I and compliant with initial HEP.    Time 2    Period Weeks    Status New    Target Date 09/01/20  PT Long Term Goals - 08/18/20 1621      PT LONG TERM GOAL #1   Title Pt will be I with long term HEP + progressions for maintenance and continued postural alignment .    Time 6    Period Weeks    Status New    Target Date 09/29/20      PT LONG TERM GOAL #2   Title Pt will decrease neck and back pain by 50% at its worst.    Time 6    Period Weeks    Status New    Target Date 09/29/20      PT LONG TERM GOAL #3   Title Pt will report minimal discomfort with lumbar AROM and demo WFL.    Time 6    Period Weeks    Status New    Target Date 09/29/20      PT LONG TERM GOAL #4   Title Pt will demo cervical AROM WFL with no pain.    Time 6    Period Weeks    Status New    Target Date 09/29/20                 Plan - 09/06/20 1506    Clinical Impression Statement Pt tolerated tx well with addition of foam roller opening mobilizations and stretches for T-spine, neck and shoulders. Additionally, added standing  rows and LAT PD with green and red tbands. Pt reported feeling much looser following session today.    PT Treatment/Interventions ADLs/Self Care Home Management;Spinal Manipulations;Joint Manipulations;Dry needling;Taping;Passive range of motion;Manual techniques;Patient/family education;Neuromuscular re-education;Therapeutic exercise;Functional mobility training;Therapeutic activities;Moist Heat;Cryotherapy    PT Next Visit Plan cont postural strengthening, increase core challenges    PT Home Exercise Plan [email or web address]: S/L book openers, seated T/S ext, standing chin tucks at wall, scap retracts    Consulted and Agree with Plan of Care Patient           Patient will benefit from skilled therapeutic intervention in order to improve the following deficits and impairments:  Hypomobility, Pain, Postural dysfunction, Impaired flexibility, Increased fascial restricitons, Improper body mechanics  Visit Diagnosis: Symptomatic mammary hypertrophy  Neck pain  Chronic bilateral low back pain without sciatica  Abnormal posture  Muscle weakness (generalized)     Problem List Patient Active Problem List   Diagnosis Date Noted  . Neck pain 08/02/2020  . Back pain 08/02/2020  . Symptomatic mammary hypertrophy 08/02/2020  . PSVT (paroxysmal supraventricular tachycardia) ([place]) 09/11/2017  . Rapid palpitations 06/18/2017  . Fracture of fifth metatarsal bone of right foot 04/08/2015  . Ankle edema 08/14/2013  . Hypercholesterolemia 11/25/2012  . Multiple sclerosis ([place]) 11/25/2012  . Anxiety 11/25/2012  . Lipoma 11/25/2012  . Discomfort of back 11/25/2012  . Hx of adenomatous colonic polyps 11/25/2012  . H/O constipation     Izell [place], PT, DPT 09/06/2020, 4:38 PM  [facility] [address] [place], [place], [address] Phone: [phone]   Fax:  [phone]  Name: Barbara Ellison MRN: [id number] Date of Birth: Feb 28, 1956

## 2020-09-14 ENCOUNTER — Encounter: Payer: Self-pay | Admitting: Physical Therapy

## 2020-09-14 ENCOUNTER — Other Ambulatory Visit: Payer: Self-pay

## 2020-09-14 ENCOUNTER — Ambulatory Visit: Payer: 59 | Admitting: Physical Therapy

## 2020-09-14 DIAGNOSIS — G8929 Other chronic pain: Secondary | ICD-10-CM

## 2020-09-14 DIAGNOSIS — M542 Cervicalgia: Secondary | ICD-10-CM

## 2020-09-14 DIAGNOSIS — N62 Hypertrophy of breast: Secondary | ICD-10-CM | POA: Diagnosis not present

## 2020-09-14 DIAGNOSIS — R293 Abnormal posture: Secondary | ICD-10-CM

## 2020-09-14 DIAGNOSIS — M6281 Muscle weakness (generalized): Secondary | ICD-10-CM

## 2020-09-14 NOTE — Therapy (Signed)
[place], [place], [address] Phone: [phone]   Fax:  [phone]  Physical Therapy Treatment  Patient Details  Name: Barbara Ellison MRN: [id number] Date of Birth: [birth year] Referring Provider (PT): Audelia Hives, DO   Encounter Date: 09/14/2020   PT End of Session - 09/14/20 1507    Visit Number 4    Number of Visits 6    Authorization Type UHC Medicare    PT Start Time 1761    PT Stop Time 1540    PT Time Calculation (min) 34 min    Activity Tolerance Patient tolerated treatment well    Behavior During Therapy [facility] for tasks assessed/performed           Past Medical History:  Diagnosis Date  . Anxiety   . Fracture of fifth metatarsal bone of right foot 04/08/2015  . History of abnormal cervical Pap smear   . History of adenomatous polyp of colon    2005 & 2010  . Left ureteral stone   . Multiple sclerosis ([place]) dx 2001 (approx.) relapsing-remitting   dr Raquel Sarna pharr--  WF  . Neuromuscular disorder ([place])    MS  . PSVT (paroxysmal supraventricular tachycardia) ([place]) 07/2017   PSVT noted on 30-day event monitor.  Several episodes of short bursts lasting less than 10 beats and up to 66 seconds.  . Urgency of urination     Past Surgical History:  Procedure Laterality Date  . COLONOSCOPY  last one 2015  . EXCISION SOFT TISSUE MASS, RIGHT CHEST WALL  02-26-2013  . ORIF TOE FRACTURE Right 04/08/2015   Procedure: OPEN REDUCTION INTERNAL FIXATION (ORIF) RIGHT METATARSAL (TOE) FRACTURE;  Surgeon: Marchia Bond, MD;  Location: [facility];  Service: Orthopedics;  Laterality: Right;  . TRANSTHORACIC ECHOCARDIOGRAM  12-24-2012   Ventricular septum motion showed paradox,  ef 55-60%,   mild MR,  trivial PR and TR    There were no vitals filed for this visit.   Subjective Assessment - 09/14/20 1510    Subjective got a roller and have been using that. I feel about the usual.     Patient Stated Goals to improve posture and decrease pain    Currently in Pain? Yes    Pain Score 3     Pain Location Back    Pain Orientation Right;Left;Upper    Pain Descriptors / Indicators Aching                             [facility] Adult PT Treatment/Exercise - 09/14/20 0001      Shoulder Exercises: Supine   Other Supine Exercises core: ab set, ab set+ marching, TT taps, TT extensions, dead bug      Shoulder Exercises: Prone   Other Prone Exercises qped horiz abd, fire hydrant    Other Prone Exercises bird dog      Shoulder Exercises: ROM/Strengthening   UBE (Upper Arm Bike) 2/2 L2      Shoulder Exercises: Stretch   Other Shoulder Stretches child pose                    PT Short Term Goals - 08/18/20 1620      PT SHORT TERM GOAL #1   Title Pt will be I and compliant with initial HEP.    Time 2    Period Weeks    Status New    Target Date 09/01/20  PT Long Term Goals - 08/18/20 1621      PT LONG TERM GOAL #1   Title Pt will be I with long term HEP + progressions for maintenance and continued postural alignment .    Time 6    Period Weeks    Status New    Target Date 09/29/20      PT LONG TERM GOAL #2   Title Pt will decrease neck and back pain by 50% at its worst.    Time 6    Period Weeks    Status New    Target Date 09/29/20      PT LONG TERM GOAL #3   Title Pt will report minimal discomfort with lumbar AROM and demo WFL.    Time 6    Period Weeks    Status New    Target Date 09/29/20      PT LONG TERM GOAL #4   Title Pt will demo cervical AROM WFL with no pain.    Time 6    Period Weeks    Status New    Target Date 09/29/20                 Plan - 09/14/20 1535    Clinical Impression Statement Treatment focused on core strength todayfor postural support. Fatigue without pain increase. Tends to rest with shoulders slightly elevated but is able to correct with verbal cuing.    PT  Treatment/Interventions ADLs/Self Care Home Management;Spinal Manipulations;Joint Manipulations;Dry needling;Taping;Passive range of motion;Manual techniques;Patient/family education;Neuromuscular re-education;Therapeutic exercise;Functional mobility training;Therapeutic activities;Moist Heat;Cryotherapy    PT Next Visit Plan lifting    PT Home Exercise Plan [id number]    Consulted and Agree with Plan of Care Patient           Patient will benefit from skilled therapeutic intervention in order to improve the following deficits and impairments:  Hypomobility, Pain, Postural dysfunction, Impaired flexibility, Increased fascial restricitons, Improper body mechanics  Visit Diagnosis: Symptomatic mammary hypertrophy  Neck pain  Chronic bilateral low back pain without sciatica  Abnormal posture  Muscle weakness (generalized)     Problem List Patient Active Problem List   Diagnosis Date Noted  . Neck pain 08/02/2020  . Back pain 08/02/2020  . Symptomatic mammary hypertrophy 08/02/2020  . PSVT (paroxysmal supraventricular tachycardia) ([place]) 09/11/2017  . Rapid palpitations 06/18/2017  . Fracture of fifth metatarsal bone of right foot 04/08/2015  . Ankle edema 08/14/2013  . Hypercholesterolemia 11/25/2012  . Multiple sclerosis ([place]) 11/25/2012  . Anxiety 11/25/2012  . Lipoma 11/25/2012  . Discomfort of back 11/25/2012  . Hx of adenomatous colonic polyps 11/25/2012  . H/O constipation     Myranda Pavone C. Dehaven Sine PT, DPT 09/14/20 3:41 PM   [facility] [address] [place], [place], [address] Phone: [phone]   Fax:  [phone]  Name: Barbara Ellison MRN: [id number] Date of Birth: Jun 15, 1956

## 2020-09-21 ENCOUNTER — Encounter: Payer: Self-pay | Admitting: Physical Therapy

## 2020-09-21 ENCOUNTER — Other Ambulatory Visit: Payer: Self-pay

## 2020-09-21 ENCOUNTER — Ambulatory Visit: Payer: 59 | Admitting: Physical Therapy

## 2020-09-21 DIAGNOSIS — M545 Low back pain, unspecified: Secondary | ICD-10-CM

## 2020-09-21 DIAGNOSIS — N62 Hypertrophy of breast: Secondary | ICD-10-CM | POA: Diagnosis not present

## 2020-09-21 DIAGNOSIS — R293 Abnormal posture: Secondary | ICD-10-CM

## 2020-09-21 DIAGNOSIS — M542 Cervicalgia: Secondary | ICD-10-CM

## 2020-09-21 DIAGNOSIS — M6281 Muscle weakness (generalized): Secondary | ICD-10-CM

## 2020-09-21 DIAGNOSIS — G8929 Other chronic pain: Secondary | ICD-10-CM

## 2020-09-21 NOTE — Therapy (Signed)
[place], [place], [address] Phone: [phone]   Fax:  [phone]  Physical Therapy Treatment  Patient Details  Name: Barbara Ellison MRN: [id number] Date of Birth: 11/24/1955 Referring Provider (PT): Audelia Hives, DO   Encounter Date: 09/21/2020   PT End of Session - 09/21/20 1547    Visit Number 5    Number of Visits 6    Authorization Type UHC Medicare    PT Start Time 4332    PT Stop Time 9518    PT Time Calculation (min) 39 min    Activity Tolerance Patient tolerated treatment well    Behavior During Therapy [facility] for tasks assessed/performed           Past Medical History:  Diagnosis Date   Anxiety    Fracture of fifth metatarsal bone of right foot 04/08/2015   History of abnormal cervical Pap smear    History of adenomatous polyp of colon    2005 & 2010   Left ureteral stone    Multiple sclerosis ([place]) dx 2001 (approx.) relapsing-remitting   dr Raquel Sarna pharr--  WF   Neuromuscular disorder ([place])    MS   PSVT (paroxysmal supraventricular tachycardia) ([place]) 07/2017   PSVT noted on 30-day event monitor.  Several episodes of short bursts lasting less than 10 beats and up to 66 seconds.   Urgency of urination     Past Surgical History:  Procedure Laterality Date   COLONOSCOPY  last one 2015   EXCISION SOFT TISSUE MASS, RIGHT CHEST WALL  02-26-2013   ORIF TOE FRACTURE Right 04/08/2015   Procedure: OPEN REDUCTION INTERNAL FIXATION (ORIF) RIGHT METATARSAL (TOE) FRACTURE;  Surgeon: Marchia Bond, MD;  Location: [place];  Service: Orthopedics;  Laterality: Right;   TRANSTHORACIC ECHOCARDIOGRAM  12-24-2012   Ventricular septum motion showed paradox,  ef 55-60%,   mild MR,  trivial PR and TR    There were no vitals filed for this visit.                      [place] Adult PT Treatment/Exercise - 09/21/20 0001      Shoulder Exercises: Supine    Protraction AROM;15 reps    Other Supine Exercises foam roller: T stretch, scissors, dead bugs, alternating leg extensions      Shoulder Exercises: Prone   Other Prone Exercises qped: row, horiz abd, scaption, flexion 2lb      Shoulder Exercises: Standing   Other Standing Exercises high kneeling shoulder flexion 2lnb    Other Standing Exercises wall push ups red tband at wriss      Shoulder Exercises: Stretch   Table Stretch - Flexion 5 reps    Other Shoulder Stretches open books x5 each                    PT Short Term Goals - 08/18/20 1620      PT SHORT TERM GOAL #1   Title Pt will be I and compliant with initial HEP.    Time 2    Period Weeks    Status New    Target Date 09/01/20             PT Long Term Goals - 08/18/20 1621      PT LONG TERM GOAL #1   Title Pt will be I with long term HEP + progressions for maintenance and continued postural alignment .    Time  6    Period Weeks    Status New    Target Date 09/29/20      PT LONG TERM GOAL #2   Title Pt will decrease neck and back pain by 50% at its worst.    Time 6    Period Weeks    Status New    Target Date 09/29/20      PT LONG TERM GOAL #3   Title Pt will report minimal discomfort with lumbar AROM and demo WFL.    Time 6    Period Weeks    Status New    Target Date 09/29/20      PT LONG TERM GOAL #4   Title Pt will demo cervical AROM WFL with no pain.    Time 6    Period Weeks    Status New    Target Date 09/29/20                 Plan - 09/21/20 1626    Clinical Impression Statement Continued to progress stabilization exercises with CKC through quadruped and wall plank position. Introduced foam roller for incoorporation of core as well. good tolerance with cues required for Lt-side scapular retraction.    PT Treatment/Interventions ADLs/Self Care Home Management;Spinal Manipulations;Joint Manipulations;Dry needling;Taping;Passive range of motion;Manual techniques;Patient/family  education;Neuromuscular re-education;Therapeutic exercise;Functional mobility training;Therapeutic activities;Moist Heat;Cryotherapy    PT Next Visit Plan last visit    PT Home Exercise Plan [place] and Agree with Plan of Care Patient           Patient will benefit from skilled therapeutic intervention in order to improve the following deficits and impairments:  Hypomobility, Pain, Postural dysfunction, Impaired flexibility, Increased fascial restricitons, Improper body mechanics  Visit Diagnosis: Symptomatic mammary hypertrophy  Neck pain  Chronic bilateral low back pain without sciatica  Abnormal posture  Muscle weakness (generalized)     Problem List Patient Active Problem List   Diagnosis Date Noted   Neck pain 08/02/2020   Back pain 08/02/2020   Symptomatic mammary hypertrophy 08/02/2020   PSVT (paroxysmal supraventricular tachycardia) ([place]) 09/11/2017   Rapid palpitations 06/18/2017   Fracture of fifth metatarsal bone of right foot 04/08/2015   Ankle edema 08/14/2013   Hypercholesterolemia 11/25/2012   Multiple sclerosis ([place]) 11/25/2012   Anxiety 11/25/2012   Lipoma 11/25/2012   Discomfort of back 11/25/2012   Hx of adenomatous colonic polyps 11/25/2012   H/O constipation     Rachel Rison C. Ashlen Kiger PT, DPT 09/21/20 4:29 PM   [facility] [address] [place], [place], [address] Phone: [phone]   Fax:  [phone]  Name: Barbara Ellison MRN: [id number] Date of Birth: [birth year]

## 2020-09-28 ENCOUNTER — Encounter: Payer: Self-pay | Admitting: Physical Therapy

## 2020-09-28 ENCOUNTER — Other Ambulatory Visit: Payer: Self-pay

## 2020-09-28 ENCOUNTER — Ambulatory Visit: Payer: 59 | Attending: Plastic Surgery | Admitting: Physical Therapy

## 2020-09-28 DIAGNOSIS — N62 Hypertrophy of breast: Secondary | ICD-10-CM

## 2020-09-28 DIAGNOSIS — M6281 Muscle weakness (generalized): Secondary | ICD-10-CM | POA: Diagnosis present

## 2020-09-28 DIAGNOSIS — G8929 Other chronic pain: Secondary | ICD-10-CM | POA: Diagnosis present

## 2020-09-28 DIAGNOSIS — R293 Abnormal posture: Secondary | ICD-10-CM | POA: Diagnosis present

## 2020-09-28 DIAGNOSIS — M545 Low back pain, unspecified: Secondary | ICD-10-CM | POA: Diagnosis present

## 2020-09-28 DIAGNOSIS — M542 Cervicalgia: Secondary | ICD-10-CM | POA: Diagnosis present

## 2020-09-28 NOTE — Therapy (Signed)
[place], [place], [address] Phone: [phone]   Fax:  [phone]  Physical Therapy Treatment  Patient Details  Name: Barbara Ellison MRN: [id number] Date of Birth: 12/03/1955 Referring Provider (PT): Audelia Hives, DO   Encounter Date: 09/28/2020   PT End of Session - 09/28/20 1506    Visit Number 6    Number of Visits 6    Authorization Type UHC Medicare    PT Start Time 1505    PT Stop Time 1545    PT Time Calculation (min) 40 min    Activity Tolerance Patient tolerated treatment well    Behavior During Therapy [facility] for tasks assessed/performed           Past Medical History:  Diagnosis Date  . Anxiety   . Fracture of fifth metatarsal bone of right foot 04/08/2015  . History of abnormal cervical Pap smear   . History of adenomatous polyp of colon    2005 & 2010  . Left ureteral stone   . Multiple sclerosis ([place]) dx 2001 (approx.) relapsing-remitting   dr Raquel Sarna pharr--  WF  . Neuromuscular disorder ([place])    MS  . PSVT (paroxysmal supraventricular tachycardia) ([place]) 07/2017   PSVT noted on 30-day event monitor.  Several episodes of short bursts lasting less than 10 beats and up to 66 seconds.  . Urgency of urination     Past Surgical History:  Procedure Laterality Date  . COLONOSCOPY  last one 2015  . EXCISION SOFT TISSUE MASS, RIGHT CHEST WALL  02-26-2013  . ORIF TOE FRACTURE Right 04/08/2015   Procedure: OPEN REDUCTION INTERNAL FIXATION (ORIF) RIGHT METATARSAL (TOE) FRACTURE;  Surgeon: Marchia Bond, MD;  Location: [place];  Service: Orthopedics;  Laterality: Right;  . TRANSTHORACIC ECHOCARDIOGRAM  12-24-2012   Ventricular septum motion showed paradox,  ef 55-60%,   mild MR,  trivial PR and TR    There were no vitals filed for this visit.   Subjective Assessment - 09/28/20 1507    Subjective I am okay. I am feeling a little tense. No stretches or exercises yet  today.    Currently in Pain? Yes    Pain Score 5     Pain Location Back    Pain Orientation Upper    Pain Descriptors / Indicators --   tension             [facility] PT Assessment - 09/28/20 0001      Assessment   Medical Diagnosis Neck and Back Pain, Symptomatic Mammary Hypertrophy    Referring Provider (PT) Audelia Hives, DO      AROM   Cervical Flexion 60    Cervical Extension 50    Cervical - Right Side Bend 30    Cervical - Left Side Bend 30    Cervical - Right Rotation 60    Cervical - Left Rotation 62    Lumbar Flexion --   WFL, stretch sensation   Lumbar Extension --   neck tension    Lumbar - Right Side Bend --   knee joint line   Lumbar - Left Side Bend --   knee joint line                        [facility] Adult PT Treatment/Exercise - 09/28/20 0001      Shoulder Exercises: Supine   Other Supine Exercises dead bug      Shoulder  Exercises: Prone   Other Prone Exercises dead bug      Shoulder Exercises: Standing   Other Standing Exercises wall walk push ups    Other Standing Exercises kneeling shoulder flexion- cues for ab set      Shoulder Exercises: ROM/Strengthening   Nustep 5 min L6 UE & LE      Shoulder Exercises: Stretch   Table Stretch -Flexion Limitations done in seated & pull off of counter      Manual Therapy   Soft tissue mobilization IASTM bil upper traps & levator scap                  PT Education - 09/28/20 1636    Education Details goals, progress, importance of HEP    Person(s) Educated Patient    Methods Explanation    Comprehension Verbalized understanding            PT Short Term Goals - 08/18/20 1620      PT SHORT TERM GOAL #1   Title Pt will be I and compliant with initial HEP.    Time 2    Period Weeks    Status New    Target Date 09/01/20             PT Long Term Goals - 09/28/20 1541      PT LONG TERM GOAL #1   Title Pt will be I with long term HEP + progressions for maintenance and  continued postural alignment .    Status Achieved      PT LONG TERM GOAL #2   Title Pt will decrease neck and back pain by 50% at its worst.    Baseline about 35% improved    Status Not Met      PT LONG TERM GOAL #3   Title Pt will report minimal discomfort with lumbar AROM and demo WFL.    Status Achieved      PT LONG TERM GOAL #4   Title Pt will demo cervical AROM WFL with no pain.    Baseline some stretch at end ranges but no pain    Status Achieved                 Plan - 09/28/20 1636    Clinical Impression Statement Pt has made progress with her visits in PT but continues to experience tension along post chain as well as difficulty supporting posture. She has completed the 6 visits of PT required for surgical intervention and we discussed the importance of HEP until that time and possible benefits of returning to PT post op. Encouraged her to contact us with any further questions.    PT Treatment/Interventions ADLs/Self Care Home Management;Spinal Manipulations;Joint Manipulations;Dry needling;Taping;Passive range of motion;Manual techniques;Patient/family education;Neuromuscular re-education;Therapeutic exercise;Functional mobility training;Therapeutic activities;Moist Heat;Cryotherapy    PT Home Exercise Plan [email or web address]    Consulted and Agree with Plan of Care Patient           Patient will benefit from skilled therapeutic intervention in order to improve the following deficits and impairments:  Hypomobility, Pain, Postural dysfunction, Impaired flexibility, Increased fascial restricitons, Improper body mechanics  Visit Diagnosis: Symptomatic mammary hypertrophy  Neck pain  Chronic bilateral low back pain without sciatica  Abnormal posture  Muscle weakness (generalized)     Problem List Patient Active Problem List   Diagnosis Date Noted  . Neck pain 08/02/2020  . Back pain 08/02/2020  . Symptomatic mammary hypertrophy 08/02/2020  . PSVT (paroxysmal  supraventricular tachycardia) (  [place]) 09/11/2017  . Rapid palpitations 06/18/2017  . Fracture of fifth metatarsal bone of right foot 04/08/2015  . Ankle edema 08/14/2013  . Hypercholesterolemia 11/25/2012  . Multiple sclerosis ([place]) 11/25/2012  . Anxiety 11/25/2012  . Lipoma 11/25/2012  . Discomfort of back 11/25/2012  . Hx of adenomatous colonic polyps 11/25/2012  . H/O constipation     Breelynn Bankert C. Leeah Politano PT, DPT 09/28/20 4:39 PM   [facility] [address] [place], [place], [address] Phone: [phone]   Fax:  [phone]  Name: LAURITA PERON MRN: [id number] Date of Birth: [birth year]

## 2020-11-09 ENCOUNTER — Ambulatory Visit: Payer: 59 | Admitting: Surgical

## 2020-11-09 ENCOUNTER — Encounter: Payer: Self-pay | Admitting: Surgical

## 2020-11-09 ENCOUNTER — Other Ambulatory Visit: Payer: Self-pay

## 2020-11-09 VITALS — BP 145/88 | HR 83 | Ht 66.0 in | Wt 160.8 lb

## 2020-11-09 DIAGNOSIS — N62 Hypertrophy of breast: Secondary | ICD-10-CM

## 2020-11-09 DIAGNOSIS — M549 Dorsalgia, unspecified: Secondary | ICD-10-CM

## 2020-11-09 DIAGNOSIS — M546 Pain in thoracic spine: Secondary | ICD-10-CM | POA: Diagnosis not present

## 2020-11-09 DIAGNOSIS — M542 Cervicalgia: Secondary | ICD-10-CM | POA: Diagnosis not present

## 2020-11-09 DIAGNOSIS — G8929 Other chronic pain: Secondary | ICD-10-CM

## 2020-11-09 NOTE — Progress Notes (Cosign Needed)
   Subjective:     Patient ID: Barbara Ellison, female    DOB: May 12, 1956, 65 y.o.   MRN: [id number]  Chief Complaint  Patient presents with  . Follow-up    HPI: The patient is a 65 y.o. female here for follow-up to discuss bilateral breast reduction.  She has completed physical therapy, she reports some improvement in her symptoms but overall reports that she is still having pain in her neck, back and shoulders.  She reports that she is still having to continuously pull/pin up her bra straps and adjust her bra straps.  She also reports that she is is having shoulder grooving which is painful and bothersome to her.  She reports that she is still interested in surgical intervention for excess bilateral breast tissue.  She reports that she occasionally has rashes develop between her upper abdomen and her breasts.  Her weight has been stable at approximately 160 pounds.   Review of Systems  Constitutional: Positive for activity change.  Musculoskeletal: Positive for back pain and neck pain.     Objective:   Vital Signs BP (!) 145/88 (BP Location: Right Arm, Patient Position: Sitting, Cuff Size: Normal)   Pulse 83   Ht 5\' 6"  (1.676 m)   Wt 160 lb 12.8 oz (72.9 kg)   SpO2 99%   BMI 25.95 kg/m  Vital Signs and Nursing Note Reviewed Physical Exam Constitutional:      General: She is not in acute distress.    Appearance: Normal appearance. She is normal weight. She is not ill-appearing.  HENT:     Head: Normocephalic and atraumatic.  Pulmonary:     Effort: Pulmonary effort is normal.  Neurological:     General: No focal deficit present.     Mental Status: She is alert and oriented to person, place, and time.  Psychiatric:        Mood and Affect: Mood normal.        Behavior: Behavior normal.       Assessment/Plan:     ICD-10-CM   1. Neck pain  M54.2   2. Symptomatic mammary hypertrophy  N62   3. Discomfort of back  M54.9   4. Chronic bilateral thoracic back pain   M54.6    G89.29    The patient is a 65 year old female here for follow-up after completion of physical therapy for neck/back pain related to her macromastia.  She reports that she continues to have neck, back and shoulder pain despite completion of the PT.  She reports that she continues to have to pin/pull up her bra straps.  She reports that she is still interested in surgical intervention for removal of excess breast tissue.  Patient is a good candidate for bilateral breast reduction.  She has completed her physical therapy.   We will resubmit this to insurance.  I discussed with the patient that our surgical scheduling team would reach out to her with the results of insurance decision.   Carola Rhine Quintus Premo, PA-C 11/09/2020, 2:48 PM

## 2020-11-11 ENCOUNTER — Ambulatory Visit: Payer: 59 | Admitting: Plastic Surgery

## 2020-12-06 ENCOUNTER — Telehealth: Payer: Self-pay | Admitting: Neurology

## 2020-12-06 DIAGNOSIS — G35 Multiple sclerosis: Secondary | ICD-10-CM

## 2020-12-06 NOTE — Telephone Encounter (Signed)
Pt states re: her Interferon Beta-1b (BETASERON/EXTAVIA) 0.3 MG KIT injection her previous Dr has called in enough until the end of this month.  Pt is asking that Dr Garth Bigness RN takes down the following so she is able to get her refill for March(when its time) [facility]  [phone] and this is for her  Interferon Beta-1b (BETASERON/EXTAVIA) 0.3 MG KIT injection

## 2020-12-07 MED ORDER — INTERFERON BETA-1B 0.3 MG ~~LOC~~ KIT: 0.2500 mg | PACK | SUBCUTANEOUS | 11 refills | Status: DC

## 2020-12-07 NOTE — Addendum Note (Signed)
Addended by: Wyvonnia Lora on: 12/07/2020 02:04 PM   Modules accepted: Orders

## 2020-12-07 NOTE — Telephone Encounter (Signed)
E-scribed refill to [facility] so it will be on file for when she is due for refill.

## 2020-12-28 ENCOUNTER — Encounter: Payer: Self-pay | Admitting: Surgical

## 2020-12-28 ENCOUNTER — Other Ambulatory Visit: Payer: Self-pay

## 2020-12-28 ENCOUNTER — Ambulatory Visit (INDEPENDENT_AMBULATORY_CARE_PROVIDER_SITE_OTHER): Payer: 59 | Admitting: Surgical

## 2020-12-28 VITALS — BP 128/78 | HR 78 | Ht 66.0 in | Wt 160.0 lb

## 2020-12-28 DIAGNOSIS — G8929 Other chronic pain: Secondary | ICD-10-CM

## 2020-12-28 DIAGNOSIS — I471 Supraventricular tachycardia: Secondary | ICD-10-CM

## 2020-12-28 DIAGNOSIS — N62 Hypertrophy of breast: Secondary | ICD-10-CM

## 2020-12-28 DIAGNOSIS — M542 Cervicalgia: Secondary | ICD-10-CM

## 2020-12-28 DIAGNOSIS — M546 Pain in thoracic spine: Secondary | ICD-10-CM

## 2020-12-28 DIAGNOSIS — G35 Multiple sclerosis: Secondary | ICD-10-CM

## 2020-12-28 DIAGNOSIS — M549 Dorsalgia, unspecified: Secondary | ICD-10-CM

## 2020-12-28 MED ORDER — HYDROCODONE-ACETAMINOPHEN 5-325 MG PO TABS: 1.0000 | ORAL_TABLET | Freq: Four times a day (QID) | ORAL | 0 refills | Status: AC | PRN

## 2020-12-28 MED ORDER — ONDANSETRON HCL 4 MG PO TABS: 4.0000 mg | ORAL_TABLET | Freq: Three times a day (TID) | ORAL | 0 refills | Status: DC | PRN

## 2020-12-28 MED ORDER — CEPHALEXIN 500 MG PO CAPS: 500.0000 mg | ORAL_CAPSULE | Freq: Four times a day (QID) | ORAL | 0 refills | Status: AC

## 2020-12-28 NOTE — Progress Notes (Signed)
Patient ID: Barbara Ellison, female    DOB: 06-18-1956, 65 y.o.   MRN: [id number]  Chief Complaint  Patient presents with  . Pre-op Exam      ICD-10-CM   1. Symptomatic mammary hypertrophy  N62   2. Neck pain  M54.2   3. Discomfort of back  M54.9   4. Chronic bilateral thoracic back pain  M54.6    G89.29   5. PSVT (paroxysmal supraventricular tachycardia) (HCC)  I47.1   6. Multiple sclerosis ([place])  G35      History of Present Illness: Barbara Ellison is a 65 y.o.  female  with a history of macromastia.  She presents for preoperative evaluation for upcoming procedure, Bilateral Breast Reduction with possible liposuction, scheduled for 01/12/2021 with Dr.  Marla Roe  The patient has not had problems with anesthesia. No history of DVT/PE.  No family history of DVT/PE.  No family or personal history of bleeding or clotting disorders.  Patient is not currently taking any blood thinners.  No history of CVA/MI.   Summary of Previous Visit: STN on the right is 32 cm and the left is 31 cm.  IMF distance is 16 cm.  Preoperative bra size is 38 triple D cup.  She would like to be around a B/C cup.  Estimated excess breast tissue to be removed at the time of surgery equals 455 g on the left and 455 g on the right.  No family history of breast cancer.  She is a non-smoker.  She is a prediabetic and her last hemoglobin A1c was 6.2.  PMH Significant for: Anxiety, multiple sclerosis, paroxysmal supraventricular tachycardia.  Patient reports she is doing well.  She reports she has not had a multiple sclerosis flareup and a very long time, she reports that she has not really had any MS flareups.  She reports that was diagnosed because of some pain in her neck, but reports that her most recent MRI showed that everything appeared to be stable.  She does report that she would like to travel to [place] after her surgery, she was wanting to plan it for April which would be 3 to 4 weeks after her  surgery.  Past Medical History: Allergies: No Known Allergies  Current Medications:  Current Outpatient Medications:  .  ALPRAZolam (XANAX) 0.25 MG tablet, Take 0.25 mg by mouth as needed for anxiety., Disp: , Rfl:  .  atorvastatin (LIPITOR) 10 MG tablet, Take 10 mg by mouth every morning. , Disp: , Rfl:  .  FLUoxetine (PROZAC) 20 MG capsule, Take 1 capsule (20 mg total) by mouth daily., Disp: 90 capsule, Rfl: 1 .  Interferon Beta-1b (BETASERON/EXTAVIA) 0.3 MG KIT injection, Inject 0.25 mg into the skin every other day. Qod  In the pm, Disp: 1 each, Rfl: 11 .  Multiple Vitamins-Minerals (WOMENS ONE DAILY PO), Take 1 tablet by mouth daily., Disp: , Rfl:  .  vitamin E 400 UNIT capsule, Take 400 Units by mouth daily., Disp: , Rfl:  .  zolpidem (AMBIEN CR) 12.5 MG CR tablet, Take 12.5 mg by mouth at bedtime as needed for sleep (sleep). , Disp: , Rfl:   Past Medical Problems: Past Medical History:  Diagnosis Date  . Anxiety   . Fracture of fifth metatarsal bone of right foot 04/08/2015  . History of abnormal cervical Pap smear   . History of adenomatous polyp of colon    2005 & 2010  . Left ureteral stone   .  Multiple sclerosis ([place]) dx 2001 (approx.) relapsing-remitting   dr Raquel Sarna pharr--  [facility]  . Neuromuscular disorder ([place])    MS  . PSVT (paroxysmal supraventricular tachycardia) ([place]) 07/2017   PSVT noted on 30-day event monitor.  Several episodes of short bursts lasting less than 10 beats and up to 66 seconds.  . Urgency of urination     Past Surgical History: Past Surgical History:  Procedure Laterality Date  . COLONOSCOPY  last one 2015  . EXCISION SOFT TISSUE MASS, RIGHT CHEST WALL  02-26-2013  . ORIF TOE FRACTURE Right 04/08/2015   Procedure: OPEN REDUCTION INTERNAL FIXATION (ORIF) RIGHT METATARSAL (TOE) FRACTURE;  Surgeon: Marchia Bond, MD;  Location: [facility];  Service: Orthopedics;  Laterality: Right;  . TRANSTHORACIC ECHOCARDIOGRAM  12-24-2012    Ventricular septum motion showed paradox,  ef 55-60%,   mild MR,  trivial PR and TR    Social History: Social History   Socioeconomic History  . Marital status: Married    Spouse name: Lileigh Fahringer  . Number of children: 0  . Years of education: Not on file  . Highest education level: Not on file  Occupational History  . 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 Smoking status: Never Smoker  . Smokeless tobacco: Never Used  Substance and Sexual Activity  . Alcohol use: Yes    Comment: 3-4 glasses of wine/week  . Drug use: No  . Sexual activity: Not on file  Other Topics Concern  . Not on file  Social History Narrative   Right handed   No caffeine use   Social Determinants of Health   Financial Resource Strain: Not on file  Food Insecurity: Not on file  Transportation Needs: Not on file  Physical Activity: Not on file  Stress: Not on file  Social Connections: Not on file  Intimate Partner Violence: Not on file    Family History: Family History  Problem Relation Age of Onset  . Cancer Mother        Uterine ca  . Heart disease Father 22       Fatal heart attack -> cardiac arrest  . Heart attack Father   . Hypertension Brother   . High Cholesterol Brother   . Diabetes Brother   . Cancer Brother   . Hypertension Brother   . Diabetes Mellitus II Brother   . High Cholesterol Brother   . Stroke Maternal Aunt   . Diabetes Paternal Aunt     Review of Systems: Review of Systems  Constitutional: Negative.   Respiratory: Negative.   Cardiovascular: Negative.   Gastrointestinal: Negative.   Neurological: Negative.     Physical Exam: Vital Signs BP 128/78 (BP Location: Right Arm, Patient Position: Sitting, Cuff Size: Large)   Pulse 78   Ht _0  (1.676 m)   Wt 160 lb (72.6 kg)   SpO2 98%   BMI 25.82 kg/m   Physical Exam Constitutional:      General: Not in acute distress.    Appearance: Normal appearance. Not  ill-appearing.  HENT:     Head: Normocephalic and atraumatic.  Eyes:     Pupils: Pupils are equal, round Neck:     Musculoskeletal: Normal range of motion.  Cardiovascular:     Rate and Rhythm: Normal rate and regular rhythm.     Pulses: Normal pulses.     Heart sounds: Normal heart sounds. No murmur.  Pulmonary:     Effort:  Pulmonary effort is normal. No respiratory distress.     Breath sounds: Normal breath sounds. No wheezing.  Abdominal:     General: Abdomen is flat. There is no distension.     Palpations: Abdomen is soft.     Tenderness: There is no abdominal tenderness.  Musculoskeletal: Normal range of motion.  Skin:    General: Skin is warm and dry.     Findings: No erythema or rash.  Neurological:     General: No focal deficit present.     Mental Status: Alert and oriented to person, place, and time. Mental status is at baseline.     Motor: No weakness.  Psychiatric:        Mood and Affect: Mood normal.        Behavior: Behavior normal.    Assessment/Plan: The patient is scheduled for bilateral breast reduction with possible liposuction with Dr. Marla Roe.  Risks, benefits, and alternatives of procedure discussed, questions answered and consent obtained.    Smoking Status: Non-smoker; Counseling Given?  N/A Last Mammogram: We received her negative mammogram report from [place] on 12/08/2019.  Caprini Score: 5; Risk Factors include: age, BMI > 25, and length of planned surgery. Recommendation for mechanical and pharmacological prophylaxis. Encourage early ambulation.   Pictures obtained:_0   Post-op Rx sent to pharmacy: Norco, Zofran, Keflex  Patient was provided with the breast reduction and General Surgical Risk consent document and Pain Medication Agreement prior to their appointment.  They had adequate time to read through the risk consent documents and Pain Medication Agreement. We also discussed them in person together during this preop  appointment. All of their questions were answered to their satisfaction.  Recommended calling if they have any further questions.  Risk consent form and Pain Medication Agreement to be scanned into patient's chart.  The risk that can be encountered with breast reduction were discussed and include the following but not limited to these:  Breast asymmetry, fluid accumulation, firmness of the breast, inability to breast feed, loss of nipple or areola, skin loss, decrease or no nipple sensation, fat necrosis of the breast tissue, bleeding, infection, healing delay.  There are risks of anesthesia, changes to skin sensation and injury to nerves or blood vessels.  The muscle can be temporarily or permanently injured.  You may have an allergic reaction to tape, suture, glue, blood products which can result in skin discoloration, swelling, pain, skin lesions, poor healing.  Any of these can lead to the need for revisonal surgery or stage procedures.  A reduction has potential to interfere with diagnostic procedures.  Nipple or breast piercing can increase risks of infection.  This procedure is best done when the breast is fully developed.  Changes in the breast will continue to occur over time.  Pregnancy can alter the outcomes of previous breast reduction surgery, weight gain and weigh loss can also effect the long term appearance.     Electronically signed by: Carola Rhine Scheeler, PA-C 12/28/2020 3:04 PM

## 2020-12-28 NOTE — H&P (View-Only) (Signed)
Patient ID: Barbara Ellison, female    DOB: 06-18-1956, 65 y.o.   MRN: [id number]  Chief Complaint  Patient presents with  . Pre-op Exam      ICD-10-CM   1. Symptomatic mammary hypertrophy  N62   2. Neck pain  M54.2   3. Discomfort of back  M54.9   4. Chronic bilateral thoracic back pain  M54.6    G89.29   5. PSVT (paroxysmal supraventricular tachycardia) (HCC)  I47.1   6. Multiple sclerosis ([place])  G35      History of Present Illness: Barbara Ellison is a 65 y.o.  female  with a history of macromastia.  She presents for preoperative evaluation for upcoming procedure, Bilateral Breast Reduction with possible liposuction, scheduled for 01/12/2021 with Dr.  Marla Roe  The patient has not had problems with anesthesia. No history of DVT/PE.  No family history of DVT/PE.  No family or personal history of bleeding or clotting disorders.  Patient is not currently taking any blood thinners.  No history of CVA/MI.   Summary of Previous Visit: STN on the right is 32 cm and the left is 31 cm.  IMF distance is 16 cm.  Preoperative bra size is 38 triple D cup.  She would like to be around a B/C cup.  Estimated excess breast tissue to be removed at the time of surgery equals 455 g on the left and 455 g on the right.  No family history of breast cancer.  She is a non-smoker.  She is a prediabetic and her last hemoglobin A1c was 6.2.  PMH Significant for: Anxiety, multiple sclerosis, paroxysmal supraventricular tachycardia.  Patient reports she is doing well.  She reports she has not had a multiple sclerosis flareup and a very long time, she reports that she has not really had any MS flareups.  She reports that was diagnosed because of some pain in her neck, but reports that her most recent MRI showed that everything appeared to be stable.  She does report that she would like to travel to [place] after her surgery, she was wanting to plan it for April which would be 3 to 4 weeks after her  surgery.  Past Medical History: Allergies: No Known Allergies  Current Medications:  Current Outpatient Medications:  .  ALPRAZolam (XANAX) 0.25 MG tablet, Take 0.25 mg by mouth as needed for anxiety., Disp: , Rfl:  .  atorvastatin (LIPITOR) 10 MG tablet, Take 10 mg by mouth every morning. , Disp: , Rfl:  .  FLUoxetine (PROZAC) 20 MG capsule, Take 1 capsule (20 mg total) by mouth daily., Disp: 90 capsule, Rfl: 1 .  Interferon Beta-1b (BETASERON/EXTAVIA) 0.3 MG KIT injection, Inject 0.25 mg into the skin every other day. Qod  In the pm, Disp: 1 each, Rfl: 11 .  Multiple Vitamins-Minerals (WOMENS ONE DAILY PO), Take 1 tablet by mouth daily., Disp: , Rfl:  .  vitamin E 400 UNIT capsule, Take 400 Units by mouth daily., Disp: , Rfl:  .  zolpidem (AMBIEN CR) 12.5 MG CR tablet, Take 12.5 mg by mouth at bedtime as needed for sleep (sleep). , Disp: , Rfl:   Past Medical Problems: Past Medical History:  Diagnosis Date  . Anxiety   . Fracture of fifth metatarsal bone of right foot 04/08/2015  . History of abnormal cervical Pap smear   . History of adenomatous polyp of colon    2005 & 2010  . Left ureteral stone   .  Multiple sclerosis ([place]) dx 2001 (approx.) relapsing-remitting   dr Raquel Sarna pharr--  [facility]  . Neuromuscular disorder ([place])    MS  . PSVT (paroxysmal supraventricular tachycardia) ([place]) 07/2017   PSVT noted on 30-day event monitor.  Several episodes of short bursts lasting less than 10 beats and up to 66 seconds.  . Urgency of urination     Past Surgical History: Past Surgical History:  Procedure Laterality Date  . COLONOSCOPY  last one 2015  . EXCISION SOFT TISSUE MASS, RIGHT CHEST WALL  02-26-2013  . ORIF TOE FRACTURE Right 04/08/2015   Procedure: OPEN REDUCTION INTERNAL FIXATION (ORIF) RIGHT METATARSAL (TOE) FRACTURE;  Surgeon: Marchia Bond, MD;  Location: [facility];  Service: Orthopedics;  Laterality: Right;  . TRANSTHORACIC ECHOCARDIOGRAM  12-24-2012    Ventricular septum motion showed paradox,  ef 55-60%,   mild MR,  trivial PR and TR    Social History: Social History   Socioeconomic History  . Marital status: Married    Spouse name: Lileigh Fahringer  . Number of children: 0  . Years of education: Not on file  . Highest education level: Not on file  Occupational History  . 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 Smoking status: Never Smoker  . Smokeless tobacco: Never Used  Substance and Sexual Activity  . Alcohol use: Yes    Comment: 3-4 glasses of wine/week  . Drug use: No  . Sexual activity: Not on file  Other Topics Concern  . Not on file  Social History Narrative   Right handed   No caffeine use   Social Determinants of Health   Financial Resource Strain: Not on file  Food Insecurity: Not on file  Transportation Needs: Not on file  Physical Activity: Not on file  Stress: Not on file  Social Connections: Not on file  Intimate Partner Violence: Not on file    Family History: Family History  Problem Relation Age of Onset  . Cancer Mother        Uterine ca  . Heart disease Father 22       Fatal heart attack -> cardiac arrest  . Heart attack Father   . Hypertension Brother   . High Cholesterol Brother   . Diabetes Brother   . Cancer Brother   . Hypertension Brother   . Diabetes Mellitus II Brother   . High Cholesterol Brother   . Stroke Maternal Aunt   . Diabetes Paternal Aunt     Review of Systems: Review of Systems  Constitutional: Negative.   Respiratory: Negative.   Cardiovascular: Negative.   Gastrointestinal: Negative.   Neurological: Negative.     Physical Exam: Vital Signs BP 128/78 (BP Location: Right Arm, Patient Position: Sitting, Cuff Size: Large)   Pulse 78   Ht _0  (1.676 m)   Wt 160 lb (72.6 kg)   SpO2 98%   BMI 25.82 kg/m   Physical Exam Constitutional:      General: Not in acute distress.    Appearance: Normal appearance. Not  ill-appearing.  HENT:     Head: Normocephalic and atraumatic.  Eyes:     Pupils: Pupils are equal, round Neck:     Musculoskeletal: Normal range of motion.  Cardiovascular:     Rate and Rhythm: Normal rate and regular rhythm.     Pulses: Normal pulses.     Heart sounds: Normal heart sounds. No murmur.  Pulmonary:     Effort:  Pulmonary effort is normal. No respiratory distress.     Breath sounds: Normal breath sounds. No wheezing.  Abdominal:     General: Abdomen is flat. There is no distension.     Palpations: Abdomen is soft.     Tenderness: There is no abdominal tenderness.  Musculoskeletal: Normal range of motion.  Skin:    General: Skin is warm and dry.     Findings: No erythema or rash.  Neurological:     General: No focal deficit present.     Mental Status: Alert and oriented to person, place, and time. Mental status is at baseline.     Motor: No weakness.  Psychiatric:        Mood and Affect: Mood normal.        Behavior: Behavior normal.    Assessment/Plan: The patient is scheduled for bilateral breast reduction with possible liposuction with Dr. Marla Roe.  Risks, benefits, and alternatives of procedure discussed, questions answered and consent obtained.    Smoking Status: Non-smoker; Counseling Given?  N/A Last Mammogram: We received her negative mammogram report from [place] on 12/08/2019.  Caprini Score: 5; Risk Factors include: age, BMI > 25, and length of planned surgery. Recommendation for mechanical and pharmacological prophylaxis. Encourage early ambulation.   Pictures obtained:_0   Post-op Rx sent to pharmacy: Norco, Zofran, Keflex  Patient was provided with the breast reduction and General Surgical Risk consent document and Pain Medication Agreement prior to their appointment.  They had adequate time to read through the risk consent documents and Pain Medication Agreement. We also discussed them in person together during this preop  appointment. All of their questions were answered to their satisfaction.  Recommended calling if they have any further questions.  Risk consent form and Pain Medication Agreement to be scanned into patient's chart.  The risk that can be encountered with breast reduction were discussed and include the following but not limited to these:  Breast asymmetry, fluid accumulation, firmness of the breast, inability to breast feed, loss of nipple or areola, skin loss, decrease or no nipple sensation, fat necrosis of the breast tissue, bleeding, infection, healing delay.  There are risks of anesthesia, changes to skin sensation and injury to nerves or blood vessels.  The muscle can be temporarily or permanently injured.  You may have an allergic reaction to tape, suture, glue, blood products which can result in skin discoloration, swelling, pain, skin lesions, poor healing.  Any of these can lead to the need for revisonal surgery or stage procedures.  A reduction has potential to interfere with diagnostic procedures.  Nipple or breast piercing can increase risks of infection.  This procedure is best done when the breast is fully developed.  Changes in the breast will continue to occur over time.  Pregnancy can alter the outcomes of previous breast reduction surgery, weight gain and weigh loss can also effect the long term appearance.     Electronically signed by: Carola Rhine Mirelle Biskup, PA-C 12/28/2020 3:04 PM

## 2021-01-03 ENCOUNTER — Ambulatory Visit: Payer: 59 | Admitting: Neurology

## 2021-01-03 ENCOUNTER — Other Ambulatory Visit: Payer: Self-pay

## 2021-01-03 ENCOUNTER — Encounter: Payer: Self-pay | Admitting: Neurology

## 2021-01-03 VITALS — BP 105/65 | HR 82 | Ht 66.0 in | Wt 162.5 lb

## 2021-01-03 DIAGNOSIS — E559 Vitamin D deficiency, unspecified: Secondary | ICD-10-CM

## 2021-01-03 DIAGNOSIS — G35 Multiple sclerosis: Secondary | ICD-10-CM

## 2021-01-03 DIAGNOSIS — Z79899 Other long term (current) drug therapy: Secondary | ICD-10-CM

## 2021-01-03 DIAGNOSIS — R3915 Urgency of urination: Secondary | ICD-10-CM

## 2021-01-03 DIAGNOSIS — R413 Other amnesia: Secondary | ICD-10-CM | POA: Diagnosis not present

## 2021-01-03 MED ORDER — TOLTERODINE TARTRATE ER 4 MG PO CP24: 4.0000 mg | ORAL_CAPSULE | Freq: Every day | ORAL | 11 refills | Status: DC

## 2021-01-03 NOTE — Progress Notes (Signed)
[facility]  PATIENT: Barbara Ellison DOB: 1955-12-06  REFERRING DOCTOR OR PCP: Leighton Ruff, MD SOURCE: Patient, notes from Dr. Drema Dallas, notes from [facility] neurology, multiple imaging reports  _________________________________   HISTORICAL  CHIEF COMPLAINT:  Chief Complaint  Patient presents with  . Follow-up    Rm 12, alone. Last seen 07/05/20. On Betaseron for MS. No new sx.     HISTORY OF PRESENT ILLNESS:  Barbara Ellison is a 65 y.o. woman with  multiple sclerosis.  Update 01/03/2021: She is doing well and denies exacerbations or new neurologic symptoms.   She is on Betaseron (since 20040 and tolerates it fairly well.  She denies difficulty with gait or balance and can go up/down strairs without the bannister.    She notes no major problem with strength or sensation though left sometimes feels slightly weak.    Her vision is fine.      She has noticed urinary urgency with rare incontinence.   She was once on a bladder medication but stopped since symptoms were milder.    She denies burning.      She notes no difficulty with fatigue.She sleeps ok but sometimes needs to takes Ambien or xanax.   She denies depression but has some anxiety and mood swings at times.   She notes short term memory and focus/attention is reduced.       MS History: She was diagnosed with MS in 2004 by Dr. Erling Cruz.  At that time, she presented with numbness and tingling.  She had a Lhermitte sign when she bent her neck.  He had MRIs of the brain and cervical spine.  The MRI of the brain showed a couple T2/FLAIR hyperintense foci including 1 in the corpus callosum that enhanced after contrast.  The MRI of the cervical spine showed 3 lesions, more to the left.  She has not had any exacerbations since the initial presentation.  She was placed on Betaseron.  She continues on that medication and tolerates it well.  She started to see Dr. Dellis Filbert at [facility].  When he moved away she  began to see Dr. Shelia Media.   IMAGING MRI of the brain 07/27/2020 showed T2/FLAIR hyperintense foci predominantly in the periventricular white matter in a pattern and configuration consistent with chronic demyelinating plaque associated with multiple sclerosis.  None of the foci appear to be acute  MRI of the cervical spine shows Two T2 hyperintense foci within the spinal cord towards the right adjacent to C4 and C7-T1.  By report, both of these were present in 2004.  There do not appear to be any new lesion.    Multilevel degenerative changes at C3-C4 through C5-C6 as detailed above that do not lead to nerve root compression.  There is borderline spinal stenosis at C5-C6.  MRI of the brain 12/02/2013 showed a few periventricular and deep white matter white matter foci consistent with MS.  No enhancing lesions or change compared to 2012.  MRI of the brain 01/09/2011 showed a few periventricular and deep white matter foci consistent with MS.  MRI report of the brain 11/04/2002 described several white matter foci including 1 in the left corpus callosum that enhanced after contrast.  MRI report 11/04/2002 described foci within the spinal cord to the left at C3-C4, C4-C5 and C7-T1 they do not enhance.  REVIEW OF SYSTEMS: Constitutional: No fevers, chills, sweats, or change in appetite Eyes: No visual changes, double vision, eye pain Ear, nose and throat: No  hearing loss, ear pain, nasal congestion, sore throat Cardiovascular: No chest pain, palpitations Respiratory: No shortness of breath at rest or with exertion.   No wheezes GastrointestinaI: No nausea, vomiting, diarrhea, abdominal pain, fecal incontinence Genitourinary: No dysuria, urinary retention or frequency.  No nocturia. Musculoskeletal: No neck pain, back pain Integumentary: No rash, pruritus, skin lesions Neurological: as above Psychiatric: No depression at this time.  No anxiety Endocrine: No palpitations, diaphoresis, change in appetite,  change in weigh or increased thirst Hematologic/Lymphatic: No anemia, purpura, petechiae. Allergic/Immunologic: No itchy/runny eyes, nasal congestion, recent allergic reactions, rashes  ALLERGIES: No Known Allergies  HOME MEDICATIONS:  Current Outpatient Medications:  .  ALPRAZolam (XANAX) 0.25 MG tablet, Take 0.25 mg by mouth as needed for anxiety., Disp: , Rfl:  .  atorvastatin (LIPITOR) 10 MG tablet, Take 10 mg by mouth every morning. , Disp: , Rfl:  .  FLUoxetine (PROZAC) 20 MG capsule, Take 1 capsule (20 mg total) by mouth daily., Disp: 90 capsule, Rfl: 1 .  Interferon Beta-1b (BETASERON/EXTAVIA) 0.3 MG KIT injection, Inject 0.25 mg into the skin every other day. Qod  In the pm, Disp: 1 each, Rfl: 11 .  Multiple Vitamins-Minerals (WOMENS ONE DAILY PO), Take 1 tablet by mouth daily., Disp: , Rfl:  .  ondansetron (ZOFRAN) 4 MG tablet, Take 1 tablet (4 mg total) by mouth every 8 (eight) hours as needed for nausea or vomiting., Disp: 20 tablet, Rfl: 0 .  tolterodine (DETROL LA) 4 MG 24 hr capsule, Take 1 capsule (4 mg total) by mouth daily., Disp: 30 capsule, Rfl: 11 .  vitamin E 400 UNIT capsule, Take 400 Units by mouth daily., Disp: , Rfl:  .  zolpidem (AMBIEN CR) 12.5 MG CR tablet, Take 12.5 mg by mouth at bedtime as needed for sleep (sleep). , Disp: , Rfl:   PAST MEDICAL HISTORY: Past Medical History:  Diagnosis Date  . Anxiety   . Fracture of fifth metatarsal bone of right foot 04/08/2015  . History of abnormal cervical Pap smear   . History of adenomatous polyp of colon    2005 & 2010  . Left ureteral stone   . Multiple sclerosis ([place]) dx 2001 (approx.) relapsing-remitting   dr Raquel Sarna pharr--  [facility]  . Neuromuscular disorder ([place])    MS  . PSVT (paroxysmal supraventricular tachycardia) ([place]) 07/2017   PSVT noted on 30-day event monitor.  Several episodes of short bursts lasting less than 10 beats and up to 66 seconds.  . Urgency of urination     PAST SURGICAL HISTORY: Past  Surgical History:  Procedure Laterality Date  . COLONOSCOPY  last one 2015  . EXCISION SOFT TISSUE MASS, RIGHT CHEST WALL  02-26-2013  . ORIF TOE FRACTURE Right 04/08/2015   Procedure: OPEN REDUCTION INTERNAL FIXATION (ORIF) RIGHT METATARSAL (TOE) FRACTURE;  Surgeon: Marchia Bond, MD;  Location: [place];  Service: Orthopedics;  Laterality: Right;  . TRANSTHORACIC ECHOCARDIOGRAM  12-24-2012   Ventricular septum motion showed paradox,  ef 55-60%,   mild MR,  trivial PR and TR    FAMILY HISTORY: Family History  Problem Relation Age of Onset  . Cancer Mother        Uterine ca  . Heart disease Father 7       Fatal heart attack -> cardiac arrest  . Heart attack Father   . Hypertension Brother   . High Cholesterol Brother   . Diabetes Brother   . Cancer Brother   . Hypertension Brother   .  Diabetes Mellitus II Brother   . High Cholesterol Brother   . Stroke Maternal Aunt   . Diabetes Paternal Aunt     SOCIAL HISTORY:  Social History   Socioeconomic History  . Marital status: Married    Spouse name: Etosha Wetherell  . Number of children: 0  . Years of education: Not on file  . Highest education level: Not on file  Occupational History  . 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 Smoking status: Never Smoker  . Smokeless tobacco: Never Used  Substance and Sexual Activity  . Alcohol use: Yes    Comment: 3-4 glasses of wine/week  . Drug use: No  . Sexual activity: Not on file  Other Topics Concern  . Not on file  Social History Narrative   Right handed   No caffeine use   Social Determinants of Health   Financial Resource Strain: Not on file  Food Insecurity: Not on file  Transportation Needs: Not on file  Physical Activity: Not on file  Stress: Not on file  Social Connections: Not on file  Intimate Partner Violence: Not on file     PHYSICAL EXAM  Vitals:   01/03/21 1248  BP: 105/65  Pulse: 82  SpO2: 98%   Weight: 162 lb 8 oz (73.7 kg)  Height: '5\' 6"'  (1.676 m)    Body mass index is 26.23 kg/m.   General: The patient is well-developed and well-nourished and in no acute distress  HEENT:  Head is [place]/AT.  Sclera are anicteric.   Skin: Extremities are without rash or  edema.   Neurologic Exam  Mental status: The patient is alert and oriented x 3 at the time of the examination. The patient has apparent normal recent and remote memory, with an apparently normal attention span and concentration ability.   Speech is normal.  Cranial nerves: Extraocular movements are full.  There is good facial sensation to soft touch bilaterally.Facial strength is normal.  Trapezius and sternocleidomastoid strength is normal. No dysarthria is noted.  The tongue is midline, and the patient has symmetric elevation of the soft palate. No obvious hearing deficits are noted.  Motor:  Muscle bulk is normal.   Tone is normal. Strength is  5 / 5 in all 4 extremities.   Sensory: Sensory testing is intact to pinprick, soft touch and vibration sensation in all 4 extremities.  Coordination: Cerebellar testing reveals good finger-nose-finger and heel-to-shin bilaterally.  Gait and station: Station is normal.   Gait is normal. Tandem gait is slightly wide . Marland Kitchen Romberg is negative.   Reflexes: Deep tendon reflexes are symmetric and normal bilaterally.      ASSESSMENT AND PLAN  Multiple sclerosis ([place]) - Plan: TSH, CBC with Differential/Platelet, Comprehensive metabolic panel, Vitamin K93, HIV Antibody (routine testing w rflx), Hepatitis B surface antigen, QuantiFERON-TB Gold Plus, Hepatitis B core antibody, total, Hepatitis B surface antibody,qualitative, Varicella zoster antibody, IgG, Hepatitis C antibody  High risk medication use - Plan: TSH, CBC with Differential/Platelet, Comprehensive metabolic panel, HIV Antibody (routine testing w rflx), Hepatitis B surface antigen, QuantiFERON-TB Gold Plus, Hepatitis B core  antibody, total, Hepatitis B surface antibody,qualitative, Varicella zoster antibody, IgG, Hepatitis C antibody  Vitamin D deficiency - Plan: VITAMIN D 25 Hydroxy (Vit-D Deficiency, Fractures)  Memory loss - Plan: TSH, Vitamin B12  Urinary urgency   1.   We discussed stopping Betaseron and considering Mavenclad.  This would allow her to stop every other day  medication in favor of the short course treatment.   2.   Detrol for bladder 3.   Check B12, vit D and TSH due to decreased focus, memory 4.   rtc 6 months, sooner if new or worsening issues.     Richard A. Felecia Shelling, MD, [facility] 07/08/3715, 9:67 PM Certified in Neurology, Clinical Neurophysiology, Sleep Medicine and Neuroimaging  [facility] [address] [place], [place] [address] [phone]

## 2021-01-05 ENCOUNTER — Encounter (HOSPITAL_BASED_OUTPATIENT_CLINIC_OR_DEPARTMENT_OTHER): Payer: Self-pay | Admitting: Plastic Surgery

## 2021-01-05 ENCOUNTER — Other Ambulatory Visit: Payer: Self-pay

## 2021-01-06 LAB — URINALYSIS, ROUTINE W REFLEX MICROSCOPIC
Bilirubin, UA: NEGATIVE
Glucose, UA: NEGATIVE
Ketones, UA: NEGATIVE
Leukocytes,UA: NEGATIVE
Nitrite, UA: NEGATIVE
Protein,UA: NEGATIVE
RBC, UA: NEGATIVE
Specific Gravity, UA: 1.023 (ref 1.005–1.030)
Urobilinogen, Ur: 0.2 mg/dL (ref 0.2–1.0)
pH, UA: 6.5 (ref 5.0–7.5)

## 2021-01-06 LAB — COMPREHENSIVE METABOLIC PANEL
ALT: 31 IU/L (ref 0–32)
AST: 29 IU/L (ref 0–40)
Albumin/Globulin Ratio: 1.8 (ref 1.2–2.2)
Albumin: 4.6 g/dL (ref 3.8–4.8)
Alkaline Phosphatase: 94 IU/L (ref 44–121)
BUN/Creatinine Ratio: 16 (ref 12–28)
BUN: 11 mg/dL (ref 8–27)
Bilirubin Total: 0.4 mg/dL (ref 0.0–1.2)
CO2: 24 mmol/L (ref 20–29)
Calcium: 9.8 mg/dL (ref 8.7–10.3)
Chloride: 104 mmol/L (ref 96–106)
Creatinine, Ser: 0.68 mg/dL (ref 0.57–1.00)
Globulin, Total: 2.5 g/dL (ref 1.5–4.5)
Glucose: 95 mg/dL (ref 65–99)
Potassium: 4.4 mmol/L (ref 3.5–5.2)
Sodium: 141 mmol/L (ref 134–144)
Total Protein: 7.1 g/dL (ref 6.0–8.5)
eGFR: 98 mL/min/{1.73_m2} (ref >59)

## 2021-01-06 LAB — HEPATITIS B SURFACE ANTIGEN: Hepatitis B Surface Ag: NEGATIVE

## 2021-01-06 LAB — QUANTIFERON-TB GOLD PLUS
QuantiFERON Mitogen Value: 8.58 IU/mL
QuantiFERON Nil Value: 0.05 IU/mL
QuantiFERON TB1 Ag Value: 0.07 IU/mL
QuantiFERON TB2 Ag Value: 0.06 IU/mL
QuantiFERON-TB Gold Plus: NEGATIVE

## 2021-01-06 LAB — CBC WITH DIFFERENTIAL/PLATELET
Basophils Absolute: 0 10*3/uL (ref 0.0–0.2)
Basos: 0 %
EOS (ABSOLUTE): 0 10*3/uL (ref 0.0–0.4)
Eos: 1 %
Hematocrit: 41 % (ref 34.0–46.6)
Hemoglobin: 13.3 g/dL (ref 11.1–15.9)
Immature Grans (Abs): 0 10*3/uL (ref 0.0–0.1)
Immature Granulocytes: 0 %
Lymphocytes Absolute: 2.2 10*3/uL (ref 0.7–3.1)
Lymphs: 50 %
MCH: 28.1 pg (ref 26.6–33.0)
MCHC: 32.4 g/dL (ref 31.5–35.7)
MCV: 87 fL (ref 79–97)
Monocytes Absolute: 0.3 10*3/uL (ref 0.1–0.9)
Monocytes: 8 %
Neutrophils Absolute: 1.8 10*3/uL (ref 1.4–7.0)
Neutrophils: 41 %
Platelets: 159 10*3/uL (ref 150–450)
RBC: 4.74 x10E6/uL (ref 3.77–5.28)
RDW: 12.8 % (ref 11.7–15.4)
WBC: 4.4 10*3/uL (ref 3.4–10.8)

## 2021-01-06 LAB — TSH: TSH: 0.849 u[IU]/mL (ref 0.450–4.500)

## 2021-01-06 LAB — HEPATITIS B CORE ANTIBODY, TOTAL: Hep B Core Total Ab: NEGATIVE

## 2021-01-06 LAB — VARICELLA ZOSTER ANTIBODY, IGG: Varicella zoster IgG: 1781 index (ref >165)

## 2021-01-06 LAB — VITAMIN B12: Vitamin B-12: 1196 pg/mL (ref 232–1245)

## 2021-01-06 LAB — HEPATITIS B SURFACE ANTIBODY,QUALITATIVE: Hep B Surface Ab, Qual: NONREACTIVE

## 2021-01-06 LAB — URINE CULTURE

## 2021-01-06 LAB — HIV ANTIBODY (ROUTINE TESTING W REFLEX): HIV Screen 4th Generation wRfx: NONREACTIVE

## 2021-01-06 LAB — HEPATITIS C ANTIBODY: Hep C Virus Ab: <0.1 s/co ratio (ref 0.0–0.9)

## 2021-01-06 LAB — VITAMIN D 25 HYDROXY (VIT D DEFICIENCY, FRACTURES): Vit D, 25-Hydroxy: 130.8 ng/mL — ABNORMAL HIGH (ref 30.0–100.0)

## 2021-01-09 ENCOUNTER — Telehealth: Payer: Self-pay | Admitting: Neurology

## 2021-01-09 ENCOUNTER — Encounter (HOSPITAL_BASED_OUTPATIENT_CLINIC_OR_DEPARTMENT_OTHER)
Admission: RE | Admit: 2021-01-09 | Discharge: 2021-01-09 | Disposition: A | Discharge status: Home / Self Care | Payer: 59 | Source: Ambulatory Visit | Attending: Plastic Surgery | Admitting: Plastic Surgery

## 2021-01-09 ENCOUNTER — Other Ambulatory Visit (HOSPITAL_COMMUNITY)
Admission: RE | Admit: 2021-01-09 | Discharge: 2021-01-09 | Disposition: A | Discharge status: Home / Self Care | Payer: 59 | Source: Ambulatory Visit | Attending: Plastic Surgery | Admitting: Plastic Surgery

## 2021-01-09 DIAGNOSIS — Z20822 Contact with and (suspected) exposure to covid-19: Secondary | ICD-10-CM | POA: Insufficient documentation

## 2021-01-09 DIAGNOSIS — Z01810 Encounter for preprocedural cardiovascular examination: Secondary | ICD-10-CM | POA: Insufficient documentation

## 2021-01-09 DIAGNOSIS — Z01812 Encounter for preprocedural laboratory examination: Secondary | ICD-10-CM | POA: Insufficient documentation

## 2021-01-09 LAB — SARS CORONAVIRUS 2 (TAT 6-24 HRS): SARS Coronavirus 2: NEGATIVE

## 2021-01-09 MED ORDER — CHLORHEXIDINE GLUCONATE CLOTH 2 % EX PADS: 6.0000 | MEDICATED_PAD | Freq: Once | CUTANEOUS | Status: DC

## 2021-01-09 NOTE — Telephone Encounter (Signed)
I reviewed her lab work.  If she would like to go on [place], the lab work is fine to do so.  Her vitamin D was elevated so she should reduce supplementation.  From the chart, it looks like she is scheduled for breast reduction surgery on Thursday.  We will try to reach her regarding the results and whether or not she wishes to switch to [facility] once she has recovered

## 2021-01-09 NOTE — Progress Notes (Signed)
Dr. Ambrose Pancoast reviewed pt's history, EKG, and spoke with the pt about medical/anesthesia history.  Approved pt to have surgery here at [facility] on 01/12/21.  Surgical soap given to pt and RN explained how to use it.  Pt verbalized understanding.

## 2021-01-11 NOTE — Telephone Encounter (Signed)
I called 01/11/2021 9 AM.  I got voicemail left a message

## 2021-01-12 ENCOUNTER — Other Ambulatory Visit: Payer: Self-pay

## 2021-01-12 ENCOUNTER — Encounter (HOSPITAL_BASED_OUTPATIENT_CLINIC_OR_DEPARTMENT_OTHER)
Admission: RE | Disposition: A | Discharge status: Home / Self Care | Payer: Self-pay | Source: Home / Self Care | Attending: Plastic Surgery

## 2021-01-12 ENCOUNTER — Ambulatory Visit (HOSPITAL_BASED_OUTPATIENT_CLINIC_OR_DEPARTMENT_OTHER): Payer: 59 | Admitting: Anesthesiology

## 2021-01-12 ENCOUNTER — Encounter (HOSPITAL_BASED_OUTPATIENT_CLINIC_OR_DEPARTMENT_OTHER): Payer: Self-pay | Admitting: Plastic Surgery

## 2021-01-12 ENCOUNTER — Ambulatory Visit (HOSPITAL_BASED_OUTPATIENT_CLINIC_OR_DEPARTMENT_OTHER)
Admission: RE | Admit: 2021-01-12 | Discharge: 2021-01-12 | Disposition: A | Discharge status: Home / Self Care | Payer: 59 | Attending: Plastic Surgery | Admitting: Plastic Surgery

## 2021-01-12 DIAGNOSIS — D241 Benign neoplasm of right breast: Secondary | ICD-10-CM | POA: Insufficient documentation

## 2021-01-12 DIAGNOSIS — M549 Dorsalgia, unspecified: Secondary | ICD-10-CM | POA: Insufficient documentation

## 2021-01-12 DIAGNOSIS — G35 Multiple sclerosis: Secondary | ICD-10-CM | POA: Insufficient documentation

## 2021-01-12 DIAGNOSIS — M542 Cervicalgia: Secondary | ICD-10-CM | POA: Insufficient documentation

## 2021-01-12 DIAGNOSIS — G8929 Other chronic pain: Secondary | ICD-10-CM | POA: Diagnosis not present

## 2021-01-12 DIAGNOSIS — M5489 Other dorsalgia: Secondary | ICD-10-CM

## 2021-01-12 DIAGNOSIS — D242 Benign neoplasm of left breast: Secondary | ICD-10-CM | POA: Insufficient documentation

## 2021-01-12 DIAGNOSIS — I471 Supraventricular tachycardia: Secondary | ICD-10-CM | POA: Diagnosis not present

## 2021-01-12 DIAGNOSIS — M954 Acquired deformity of chest and rib: Secondary | ICD-10-CM

## 2021-01-12 DIAGNOSIS — Z8249 Family history of ischemic heart disease and other diseases of the circulatory system: Secondary | ICD-10-CM | POA: Insufficient documentation

## 2021-01-12 DIAGNOSIS — Z809 Family history of malignant neoplasm, unspecified: Secondary | ICD-10-CM | POA: Diagnosis not present

## 2021-01-12 DIAGNOSIS — Z8049 Family history of malignant neoplasm of other genital organs: Secondary | ICD-10-CM | POA: Insufficient documentation

## 2021-01-12 DIAGNOSIS — N62 Hypertrophy of breast: Secondary | ICD-10-CM | POA: Insufficient documentation

## 2021-01-12 DIAGNOSIS — Z79899 Other long term (current) drug therapy: Secondary | ICD-10-CM | POA: Insufficient documentation

## 2021-01-12 HISTORY — PX: BREAST REDUCTION SURGERY: SHX8

## 2021-01-12 SURGERY — MAMMOPLASTY, REDUCTION
Anesthesia: General | Site: Breast | Laterality: Bilateral

## 2021-01-12 MED ORDER — DEXAMETHASONE SODIUM PHOSPHATE 10 MG/ML IJ SOLN
INTRAMUSCULAR | Status: AC
  Filled 2021-01-12: qty 1

## 2021-01-12 MED ORDER — FENTANYL CITRATE (PF) 100 MCG/2ML IJ SOLN
25.0000 ug | INTRAMUSCULAR | Status: DC | PRN
  Administered 2021-01-12: 50 ug via INTRAVENOUS

## 2021-01-12 MED ORDER — DIPHENHYDRAMINE HCL 50 MG/ML IJ SOLN
INTRAMUSCULAR | Status: DC | PRN
  Administered 2021-01-12: 6.25 mg via INTRAVENOUS

## 2021-01-12 MED ORDER — FENTANYL CITRATE (PF) 100 MCG/2ML IJ SOLN
INTRAMUSCULAR | Status: AC
  Filled 2021-01-12: qty 2

## 2021-01-12 MED ORDER — MIDAZOLAM HCL 2 MG/2ML IJ SOLN
INTRAMUSCULAR | Status: AC
  Filled 2021-01-12: qty 2

## 2021-01-12 MED ORDER — DIPHENHYDRAMINE HCL 50 MG/ML IJ SOLN
INTRAMUSCULAR | Status: AC
  Filled 2021-01-12: qty 1

## 2021-01-12 MED ORDER — LIDOCAINE-EPINEPHRINE 1 %-1:100000 IJ SOLN
INTRAMUSCULAR | Status: DC | PRN
  Administered 2021-01-12: 25 mL

## 2021-01-12 MED ORDER — SUCCINYLCHOLINE CHLORIDE 200 MG/10ML IV SOSY
PREFILLED_SYRINGE | INTRAVENOUS | Status: AC
  Filled 2021-01-12: qty 10

## 2021-01-12 MED ORDER — SUFENTANIL CITRATE 50 MCG/ML IV SOLN
INTRAVENOUS | Status: DC | PRN
  Administered 2021-01-12: 10 ug via INTRAVENOUS

## 2021-01-12 MED ORDER — CEFAZOLIN SODIUM-DEXTROSE 2-4 GM/100ML-% IV SOLN
INTRAVENOUS | Status: AC
  Filled 2021-01-12: qty 100

## 2021-01-12 MED ORDER — EPHEDRINE SULFATE 50 MG/ML IJ SOLN
INTRAMUSCULAR | Status: DC | PRN
  Administered 2021-01-12 (×2): 10 mg via INTRAVENOUS

## 2021-01-12 MED ORDER — EPINEPHRINE PF 1 MG/ML IJ SOLN
INTRAMUSCULAR | Status: AC
  Filled 2021-01-12: qty 1

## 2021-01-12 MED ORDER — EPHEDRINE 5 MG/ML INJ
INTRAVENOUS | Status: AC
  Filled 2021-01-12: qty 10

## 2021-01-12 MED ORDER — ACETAMINOPHEN 325 MG PO TABS: 650.0000 mg | ORAL_TABLET | ORAL | Status: DC | PRN

## 2021-01-12 MED ORDER — LACTATED RINGERS IV SOLN: INTRAVENOUS | Status: DC

## 2021-01-12 MED ORDER — PHENYLEPHRINE 40 MCG/ML (10ML) SYRINGE FOR IV PUSH (FOR BLOOD PRESSURE SUPPORT)
PREFILLED_SYRINGE | INTRAVENOUS | Status: AC
  Filled 2021-01-12: qty 10

## 2021-01-12 MED ORDER — LIDOCAINE 2% (20 MG/ML) 5 ML SYRINGE
INTRAMUSCULAR | Status: AC
  Filled 2021-01-12: qty 5

## 2021-01-12 MED ORDER — LIDOCAINE HCL (PF) 1 % IJ SOLN
INTRAMUSCULAR | Status: AC
  Filled 2021-01-12: qty 30

## 2021-01-12 MED ORDER — ONDANSETRON HCL 4 MG/2ML IJ SOLN
INTRAMUSCULAR | Status: AC
  Filled 2021-01-12: qty 2

## 2021-01-12 MED ORDER — FENTANYL CITRATE (PF) 100 MCG/2ML IJ SOLN
25.0000 ug | INTRAMUSCULAR | Status: DC | PRN
Start: 2021-01-12 — End: 2021-01-12
  Administered 2021-01-12: 50 ug via INTRAVENOUS

## 2021-01-12 MED ORDER — ROCURONIUM BROMIDE 10 MG/ML (PF) SYRINGE
PREFILLED_SYRINGE | INTRAVENOUS | Status: AC
  Filled 2021-01-12: qty 10

## 2021-01-12 MED ORDER — DEXAMETHASONE SODIUM PHOSPHATE 4 MG/ML IJ SOLN
INTRAMUSCULAR | Status: DC | PRN
  Administered 2021-01-12: 10 mg via INTRAVENOUS

## 2021-01-12 MED ORDER — SODIUM CHLORIDE 0.9 % IV SOLN: 250.0000 mL | INTRAVENOUS | Status: DC | PRN

## 2021-01-12 MED ORDER — LIDOCAINE HCL (CARDIAC) PF 100 MG/5ML IV SOSY
PREFILLED_SYRINGE | INTRAVENOUS | Status: DC | PRN
  Administered 2021-01-12: 80 mg via INTRAVENOUS

## 2021-01-12 MED ORDER — SUGAMMADEX SODIUM 200 MG/2ML IV SOLN
INTRAVENOUS | Status: DC | PRN
  Administered 2021-01-12: 200 mg via INTRAVENOUS

## 2021-01-12 MED ORDER — ONDANSETRON HCL 4 MG/2ML IJ SOLN
INTRAMUSCULAR | Status: DC | PRN
  Administered 2021-01-12: 4 mg via INTRAVENOUS

## 2021-01-12 MED ORDER — OXYCODONE HCL 5 MG PO TABS
5.0000 mg | ORAL_TABLET | ORAL | Status: DC | PRN
  Administered 2021-01-12: 5 mg via ORAL

## 2021-01-12 MED ORDER — BUPIVACAINE HCL (PF) 0.25 % IJ SOLN
INTRAMUSCULAR | Status: AC
  Filled 2021-01-12: qty 30

## 2021-01-12 MED ORDER — PHENYLEPHRINE HCL (PRESSORS) 10 MG/ML IV SOLN
INTRAVENOUS | Status: DC | PRN
  Administered 2021-01-12: 80 ug via INTRAVENOUS

## 2021-01-12 MED ORDER — BUPIVACAINE HCL (PF) 0.25 % IJ SOLN
INTRAMUSCULAR | Status: DC | PRN
  Administered 2021-01-12: 25 mL

## 2021-01-12 MED ORDER — ROCURONIUM BROMIDE 100 MG/10ML IV SOLN
INTRAVENOUS | Status: DC | PRN
  Administered 2021-01-12: 60 mg via INTRAVENOUS

## 2021-01-12 MED ORDER — PROPOFOL 10 MG/ML IV BOLUS
INTRAVENOUS | Status: DC | PRN
  Administered 2021-01-12: 150 mg via INTRAVENOUS

## 2021-01-12 MED ORDER — ACETAMINOPHEN 325 MG RE SUPP: 650.0000 mg | RECTAL | Status: DC | PRN

## 2021-01-12 MED ORDER — CEFAZOLIN SODIUM-DEXTROSE 2-4 GM/100ML-% IV SOLN
2.0000 g | INTRAVENOUS | Status: AC
  Administered 2021-01-12: 2 g via INTRAVENOUS

## 2021-01-12 MED ORDER — SODIUM CHLORIDE 0.9% FLUSH: 3.0000 mL | INTRAVENOUS | Status: DC | PRN

## 2021-01-12 MED ORDER — PROPOFOL 500 MG/50ML IV EMUL
INTRAVENOUS | Status: AC
  Filled 2021-01-12: qty 50

## 2021-01-12 MED ORDER — OXYCODONE HCL 5 MG PO TABS
ORAL_TABLET | ORAL | Status: AC
  Filled 2021-01-12: qty 1

## 2021-01-12 MED ORDER — SODIUM CHLORIDE 0.9% FLUSH: 3.0000 mL | Freq: Two times a day (BID) | INTRAVENOUS | Status: DC

## 2021-01-12 MED ORDER — SUFENTANIL CITRATE 50 MCG/ML IV SOLN
INTRAVENOUS | Status: AC
  Filled 2021-01-12: qty 1

## 2021-01-12 MED ORDER — LIDOCAINE-EPINEPHRINE 1 %-1:100000 IJ SOLN
INTRAMUSCULAR | Status: AC
  Filled 2021-01-12: qty 1

## 2021-01-12 SURGICAL SUPPLY — 69 items
BAG DECANTER FOR FLEXI CONT (MISCELLANEOUS) IMPLANT
BINDER BREAST LRG (GAUZE/BANDAGES/DRESSINGS) IMPLANT
BINDER BREAST MEDIUM (GAUZE/BANDAGES/DRESSINGS) IMPLANT
BINDER BREAST XLRG (GAUZE/BANDAGES/DRESSINGS) ×2 IMPLANT
BINDER BREAST XXLRG (GAUZE/BANDAGES/DRESSINGS) IMPLANT
BIOPATCH RED 1 DISK 7.0 (GAUZE/BANDAGES/DRESSINGS) IMPLANT
BLADE HEX COATED 2.75 (ELECTRODE) IMPLANT
BLADE KNIFE PERSONA 10 (BLADE) ×4 IMPLANT
BLADE SURG 15 STRL LF DISP TIS (BLADE) IMPLANT
BLADE SURG 15 STRL SS (BLADE)
BNDG GAUZE ELAST 4 BULKY (GAUZE/BANDAGES/DRESSINGS) IMPLANT
CANISTER SUCT 1200ML W/VALVE (MISCELLANEOUS) ×2 IMPLANT
COVER BACK TABLE 60X90IN (DRAPES) ×2 IMPLANT
COVER MAYO STAND STRL (DRAPES) ×2 IMPLANT
COVER WAND RF STERILE (DRAPES) IMPLANT
DECANTER SPIKE VIAL GLASS SM (MISCELLANEOUS) ×2 IMPLANT
DERMABOND ADVANCED (GAUZE/BANDAGES/DRESSINGS) ×2
DERMABOND ADVANCED .7 DNX12 (GAUZE/BANDAGES/DRESSINGS) ×2 IMPLANT
DRAIN CHANNEL 19F RND (DRAIN) IMPLANT
DRAPE LAPAROSCOPIC ABDOMINAL (DRAPES) ×2 IMPLANT
DRSG OPSITE POSTOP 4X6 (GAUZE/BANDAGES/DRESSINGS) ×4 IMPLANT
DRSG PAD ABDOMINAL 8X10 ST (GAUZE/BANDAGES/DRESSINGS) ×6 IMPLANT
ELECT BLADE 4.0 EZ CLEAN MEGAD (MISCELLANEOUS) ×2
ELECT REM PT RETURN 9FT ADLT (ELECTROSURGICAL) ×2
ELECTRODE BLDE 4.0 EZ CLN MEGD (MISCELLANEOUS) ×1 IMPLANT
ELECTRODE REM PT RTRN 9FT ADLT (ELECTROSURGICAL) ×1 IMPLANT
EVACUATOR SILICONE 100CC (DRAIN) IMPLANT
GLOVE SURG ENC MOIS LTX SZ6.5 (GLOVE) ×8 IMPLANT
GLOVE SURG SYN 7.0 (GLOVE) ×2 IMPLANT
GLOVE SURG UNDER POLY LF SZ7 (GLOVE) ×2 IMPLANT
GOWN STRL REUS W/ TWL LRG LVL3 (GOWN DISPOSABLE) ×2 IMPLANT
GOWN STRL REUS W/TWL LRG LVL3 (GOWN DISPOSABLE) ×4
MARKER SKIN DUAL TIP RULER LAB (MISCELLANEOUS) ×2 IMPLANT
NDL SAFETY ECLIPSE 18X1.5 (NEEDLE) IMPLANT
NEEDLE FILTER BLUNT 18X 1/2SAF (NEEDLE)
NEEDLE FILTER BLUNT 18X1 1/2 (NEEDLE) IMPLANT
NEEDLE HYPO 18GX1.5 SHARP (NEEDLE)
NEEDLE HYPO 25X1 1.5 SAFETY (NEEDLE) ×2 IMPLANT
NS IRRIG 1000ML POUR BTL (IV SOLUTION) ×2 IMPLANT
PACK BASIN DAY SURGERY FS (CUSTOM PROCEDURE TRAY) ×2 IMPLANT
PAD ALCOHOL SWAB (MISCELLANEOUS) IMPLANT
PAD FOAM SILICONE BACKED (GAUZE/BANDAGES/DRESSINGS) IMPLANT
PENCIL SMOKE EVACUATOR (MISCELLANEOUS) ×2 IMPLANT
PIN SAFETY STERILE (MISCELLANEOUS) IMPLANT
SLEEVE SCD COMPRESS KNEE MED (STOCKING) ×2 IMPLANT
SPONGE LAP 18X18 RF (DISPOSABLE) ×4 IMPLANT
STRIP SUTURE WOUND CLOSURE 1/2 (MISCELLANEOUS) ×6 IMPLANT
SUT MNCRL AB 4-0 PS2 18 (SUTURE) ×14 IMPLANT
SUT MON AB 3-0 SH 27 (SUTURE) ×14
SUT MON AB 3-0 SH27 (SUTURE) ×7 IMPLANT
SUT MON AB 5-0 PS2 18 (SUTURE) ×8 IMPLANT
SUT PDS 3-0 CT2 (SUTURE) ×6
SUT PDS AB 2-0 CT2 27 (SUTURE) IMPLANT
SUT PDS II 3-0 CT2 27 ABS (SUTURE) ×3 IMPLANT
SUT SILK 3 0 PS 1 (SUTURE) IMPLANT
SUT VIC AB 3-0 SH 27 (SUTURE)
SUT VIC AB 3-0 SH 27X BRD (SUTURE) IMPLANT
SUT VICRYL 4-0 PS2 18IN ABS (SUTURE) IMPLANT
SYR 50ML LL SCALE MARK (SYRINGE) IMPLANT
SYR BULB IRRIG 60ML STRL (SYRINGE) ×2 IMPLANT
SYR CONTROL 10ML LL (SYRINGE) ×2 IMPLANT
TAPE MEASURE VINYL STERILE (MISCELLANEOUS) IMPLANT
TOWEL GREEN STERILE FF (TOWEL DISPOSABLE) ×4 IMPLANT
TRAY DSU PREP LF (CUSTOM PROCEDURE TRAY) ×2 IMPLANT
TUBE CONNECTING 20X1/4 (TUBING) ×2 IMPLANT
TUBING INFILTRATION IT-10001 (TUBING) IMPLANT
TUBING SET GRADUATE ASPIR 12FT (MISCELLANEOUS) IMPLANT
UNDERPAD 30X36 HEAVY ABSORB (UNDERPADS AND DIAPERS) ×4 IMPLANT
YANKAUER SUCT BULB TIP NO VENT (SUCTIONS) ×2 IMPLANT

## 2021-01-12 NOTE — Interval H&P Note (Signed)
History and Physical Interval Note:  01/12/2021 7:14 AM  Barbara Ellison  has presented today for surgery, with the diagnosis of mammary hypertrophy.  The various methods of treatment have been discussed with the patient and family. After consideration of risks, benefits and other options for treatment, the patient has consented to  Procedure(s) with comments: BREAST REDUCTION WITH LIPOSUCTION (Bilateral) - 3 hours, please as a surgical intervention.  The patient's history has been reviewed, patient examined, no change in status, stable for surgery.  I have reviewed the patient's chart and labs.  Questions were answered to the patient's satisfaction.     [name] [name]

## 2021-01-12 NOTE — Anesthesia Procedure Notes (Signed)
Procedure Name: Intubation Date/Time: 01/12/2021 7:43 AM Performed by: Willa Frater, CRNA Pre-anesthesia Checklist: Patient identified, Emergency Drugs available, Suction available and Patient being monitored Patient Re-evaluated:Patient Re-evaluated prior to induction Oxygen Delivery Method: Circle system utilized Preoxygenation: Pre-oxygenation with 100% oxygen Induction Type: IV induction Ventilation: Mask ventilation without difficulty Tube type: Oral Tube size: 7.0 mm Number of attempts: 1 Airway Equipment and Method: Stylet and Oral airway Placement Confirmation: ETT inserted through vocal cords under direct vision,  positive ETCO2 and breath sounds checked- equal and bilateral Secured at: 22 cm Tube secured with: Tape Dental Injury: Teeth and Oropharynx as per pre-operative assessment

## 2021-01-12 NOTE — Anesthesia Preprocedure Evaluation (Signed)
Anesthesia Evaluation  Patient identified by MRN, date of birth, ID band Patient awake    Reviewed: Allergy & Precautions, NPO status   Airway Mallampati: II  TM Distance: >3 FB     Dental   Pulmonary    breath sounds clear to auscultation       Cardiovascular negative cardio ROS   Rhythm:Regular Rate:Normal     Neuro/Psych Anxiety  Neuromuscular disease    GI/Hepatic negative GI ROS, Neg liver ROS,   Endo/Other  negative endocrine ROS  Renal/GU negative Renal ROS     Musculoskeletal   Abdominal   Peds  Hematology   Anesthesia Other Findings   Reproductive/Obstetrics                             Anesthesia Physical Anesthesia Plan  ASA: II  Anesthesia Plan: General   Post-op Pain Management:    Induction:   PONV Risk Score and Plan: Ondansetron, Dexamethasone and Midazolam  Airway Management Planned: LMA  Additional Equipment:   Intra-op Plan:   Post-operative Plan:   Informed Consent: I have reviewed the patients History and Physical, chart, labs and discussed the procedure including the risks, benefits and alternatives for the proposed anesthesia with the patient or authorized representative who has indicated his/her understanding and acceptance.     Dental advisory given  Plan Discussed with: CRNA and Anesthesiologist  Anesthesia Plan Comments:         Anesthesia Quick Evaluation

## 2021-01-12 NOTE — Anesthesia Postprocedure Evaluation (Signed)
Anesthesia Post Note  Patient: Barbara Ellison  Procedure(s) Performed: MAMMARY REDUCTION  (BREAST) (Bilateral Breast)     Patient location during evaluation: PACU Anesthesia Type: General Level of consciousness: awake Pain management: pain level controlled Vital Signs Assessment: post-procedure vital signs reviewed and stable Respiratory status: spontaneous breathing Cardiovascular status: stable Postop Assessment: no apparent nausea or vomiting Anesthetic complications: no   No complications documented.  Last Vitals:  Vitals:   01/12/21 1030 01/12/21 1045  BP: 127/78 124/79  Pulse: 88 87  Resp: 11 16  Temp:    SpO2: 100% 98%    Last Pain:  Vitals:   01/12/21 1109  TempSrc:   PainSc: 4                  Jacqlyn Marolf

## 2021-01-12 NOTE — Op Note (Signed)
Breast Reduction Op note:    DATE OF PROCEDURE: 01/12/2021  LOCATION: [place]  SURGEON: Lyndee Leo Sanger Dillingham, DO  ASSISTANT: Roetta Sessions, PA  PREOPERATIVE DIAGNOSIS 1. Macromastia 2. Neck Pain 3. Back Pain  POSTOPERATIVE DIAGNOSIS 1. Macromastia 2. Neck Pain 3. Back Pain  PROCEDURES 1. Bilateral breast reduction.  Right reduction 480g, Left reduction 976B  COMPLICATIONS: None.  DRAINS: none  INDICATIONS FOR PROCEDURE Barbara Ellison is a 65 y.o. year-old female born on 15-Feb-1956,with a history of symptomatic macromastia with concominant back pain, neck pain, shoulder grooving from her bra.   MRN: [id number]  CONSENT Informed consent was obtained directly from the patient. The risks, benefits and alternatives were fully discussed. Specific risks including but not limited to bleeding, infection, hematoma, seroma, scarring, pain, nipple necrosis, asymmetry, poor cosmetic results, and need for further surgery were discussed. The patient had ample opportunity to have her questions answered to her satisfaction.  DESCRIPTION OF PROCEDURE  Patient was brought into the operating room and placed in a supine position.  SCDs were placed and appropriate padding was performed.  Antibiotics were given. The patient underwent general anesthesia and the chest was prepped and draped in a sterile fashion.  A timeout was performed and all information was confirmed to be correct.  Right side: Preoperative markings were confirmed.  Incision lines were injected with 1% Xylocaine with epinephrine.  After waiting for vasoconstriction, the marked lines were incised.  A Wise-pattern superomedial breast reduction was performed by de-epithelializing the pedicle, using bovie to create the superomedial pedicle, and removing breast tissue from the superior, lateral, and inferior portions of the breast.  Care was taken to not undermine the breast pedicle. Hemostasis was  achieved.  The nipple was gently rotated into position and the soft tissue closed with 4-0 Monocryl.   The pocket was irrigated and hemostasis confirmed.  The deep tissues were approximated with 3-0 PDS and Monocryl sutures and the skin was closed with deep dermal and subcuticular 4-0 Monocryl sutures.  The nipple and skin flaps had good capillary refill at the end of the procedure.    Left side: Preoperative markings were confirmed.  Incision lines were injected with 1% Xylocaine with epinephrine.  After waiting for vasoconstriction, the marked lines were incised.  A Wise-pattern superomedial breast reduction was performed by de-epithelializing the pedicle, using bovie to create the superomedial pedicle, and removing breast tissue from the superior, lateral, and inferior portions of the breast.  Care was taken to not undermine the breast pedicle. Hemostasis was achieved.  The nipple was gently rotated into position and the soft tissue was closed with 4-0 Monocryl.  The patient was sat upright and size and shape symmetry was confirmed.  The pocket was irrigated and hemostasis confirmed.  The deep tissues were approximated with 3-0 PDS and Monocryl sutures and the skin was closed with deep dermal and subcuticular 4-0 Monocryl sutures.  Dermabond was applied.  A breast binder and ABDs were placed.  The nipple and skin flaps had good capillary refill at the end of the procedure.  The patient tolerated the procedure well. The patient was allowed to wake from anesthesia and taken to the recovery room in satisfactory condition.  The advanced practice practitioner (APP) assisted throughout the case.  The APP was essential in retraction and counter traction when needed to make the case progress smoothly.  This retraction and assistance made it possible to see the tissue plans for the procedure.  The assistance was  needed for blood control, tissue re-approximation and assisted with closure of the incision site.

## 2021-01-12 NOTE — Discharge Instructions (Addendum)
Post Anesthesia Home Care Instructions  Activity: Get plenty of rest for the remainder of the day. A responsible individual must stay with you for 24 hours following the procedure.  For the next 24 hours, DO NOT: -Drive a car -Operate machinery -Drink alcoholic beverages -Take any medication unless instructed by your physician -Make any legal decisions or sign important papers.  Meals: Start with liquid foods such as gelatin or soup. Progress to regular foods as tolerated. Avoid greasy, spicy, heavy foods. If nausea and/or vomiting occur, drink only clear liquids until the nausea and/or vomiting subsides. Call your physician if vomiting continues.  Special Instructions/Symptoms: Your throat may feel dry or sore from the anesthesia or the breathing tube placed in your throat during surgery. If this causes discomfort, gargle with warm salt water. The discomfort should disappear within 24 hours.  If you had a scopolamine patch placed behind your ear for the management of post- operative nausea and/or vomiting:  1. The medication in the patch is effective for 72 hours, after which it should be removed.  Wrap patch in a tissue and discard in the trash. Wash hands thoroughly with soap and water. 2. You may remove the patch earlier than 72 hours if you experience unpleasant side effects which may include dry mouth, dizziness or visual disturbances. 3. Avoid touching the patch. Wash your hands with soap and water after contact with the patch.    INSTRUCTIONS FOR AFTER SURGERY   You will likely have some questions about what to expect following your operation.  The following information will help you and your family understand what to expect when you are discharged from the hospital.  Following these guidelines will help ensure a smooth recovery and reduce risks of complications.  Postoperative instructions include information on: diet, wound care, medications and physical activity.  AFTER  SURGERY Expect to go home after the procedure.  In some cases, you may need to spend one night in the hospital for observation.  DIET This surgery does not require a specific diet.  However, I have to mention that the healthier you eat the better your body can start healing. It is important to increasing your protein intake.  This means limiting the foods with added sugar.  Focus on fruits and vegetables and some meat. It is very important to drink water after your surgery.  If your urine is bright yellow, then it is concentrated, and you need to drink more water.  As a general rule after surgery, you should have 8 ounces of water every hour while awake.  If you find you are persistently nauseated or unable to take in liquids let us know.  NO TOBACCO USE or EXPOSURE.  This will slow your healing process and increase the risk of a wound.  WOUND CARE If you don't have a drain: You can shower the day after surgery.  Use fragrance free soap.  Dial, Dove, Ivory and Cetaphil are usually mild on the skin.  If you have steri-strips / tape directly attached to your skin leave them in place. It is OK to get these wet.  No baths, pools or hot tubs for two weeks. We close your incision to leave the smallest and best-looking scar. No ointment or creams on your incisions until given the go ahead.  Especially not Neosporin (Too many skin reactions with this one).  A few weeks after surgery you can use Mederma and start massaging the scar. We ask you to wear your binder or   your binder or sports bra for the first 6 weeks around the clock, including while sleeping. This provides added comfort and helps reduce the fluid accumulation at the surgery site.  ACTIVITY No heavy lifting until cleared by the doctor.  It is OK to walk and climb stairs. In fact, moving your legs is very important to decrease your risk of a blood clot.  It will also help keep you from getting deconditioned.  Every 1 to 2 hours get up and walk for 5 minutes. This  will help with a quicker recovery back to normal.  Let pain be your guide so you don't do too much.  NO, you cannot do the spring cleaning and don't plan on taking care of anyone else.  This is your time for TLC.   WORK Everyone returns to work at different times. As a rough guide, most people take at least 1 - 2 weeks off prior to returning to work. If you need documentation for your job, bring the forms to your postoperative follow up visit.  DRIVING Arrange for someone to bring you home from the hospital.  You may be able to drive a few days after surgery but not while taking any narcotics or valium.  BOWEL MOVEMENTS Constipation can occur after anesthesia and while taking pain medication.  It is important to stay ahead for your comfort.  We recommend taking Milk of Magnesia (2 tablespoons; twice a day) while taking the pain pills.  SEROMA This is fluid your body tried to put in the surgical site.  This is normal but if it creates excessive pain and swelling let us know.  It usually decreases in a few weeks.  MEDICATIONS and PAIN CONTROL At your preoperative visit for you history and physical you were given the following medications: 1. An antibiotic: Start this medication when you get home and take according to the instructions on the bottle. 2. Zofran 4 mg:  This is to treat nausea and vomiting.  You can take this every 6 hours as needed and only if needed. 3. Norco (hydrocodone/acetaminophen) 5/325 mg:  This is only to be used after you have taken the motrin or the tylenol. Every 8 hours as needed. Over the counter Medication to take: 4. Ibuprofen (Motrin) 600 mg:  Take this every 6 hours.  If you have additional pain then take 500 mg of the tylenol.  Only take the Norco after you have tried these two. 5. Miralax or stool softener of choice: Take this according to the bottle if you take the [place] Call your surgeon's office if any of the following occur: . Fever 101  degrees F or greater . Excessive bleeding or fluid from the incision site. . Pain that increases over time without aid from the medications . Redness, warmth, or pus draining from incision sites . Persistent nausea or inability to take in liquids . Severe misshapen area that underwent the operation.  You last had Oxycodone 5mg  at 1115 today.

## 2021-01-12 NOTE — Transfer of Care (Signed)
Immediate Anesthesia Transfer of Care Note  Patient: Barbara Ellison  Procedure(s) Performed: MAMMARY REDUCTION  (BREAST) (Bilateral Breast)  Patient Location: PACU  Anesthesia Type:General  Level of Consciousness: awake, alert , oriented, drowsy and patient cooperative  Airway & Oxygen Therapy: Patient Spontanous Breathing and Patient connected to face mask oxygen  Post-op Assessment: Report given to RN and Post -op Vital signs reviewed and stable  Post vital signs: Reviewed and stable  Last Vitals:  Vitals Value Taken Time  BP    Temp    Pulse 95 01/12/21 0953  Resp    SpO2 99 % 01/12/21 0953  Vitals shown include unvalidated device data.  Last Pain:  Vitals:   01/12/21 0641  TempSrc: Oral  PainSc: 0-No pain      Patients Stated Pain Goal: 3 (47/09/62 8366)  Complications: No complications documented.

## 2021-01-13 ENCOUNTER — Encounter (HOSPITAL_BASED_OUTPATIENT_CLINIC_OR_DEPARTMENT_OTHER): Payer: Self-pay | Admitting: Plastic Surgery

## 2021-01-16 ENCOUNTER — Telehealth: Payer: Self-pay | Admitting: Plastic Surgery

## 2021-01-16 LAB — SURGICAL PATHOLOGY

## 2021-01-16 NOTE — Telephone Encounter (Addendum)
Called and spoke with the patient regarding the message below.  Patient stated that she was told she could shower the next day, but she's nervous about doing that. She stated that she read the instructions for after surgery.   She stated that she has the binder on,but does she need to take the binder and pads off to take a shower.  Informed the patient yes she's to take the binder and pads off before getting in the shower.  But she's not to remove any steri-strips/tape directly attached to her skin leave them on.    Informed the patient when she gets in the shower she's to turn her back to the water, put soap on her shoulders and let the water run down on her.  Informed her not to let the water hit directly on her breast.  Patient verbalized understanding and agreed.  She asked about what to clean with.  She stated that she still has the Dyna-Hex 4 which she used before her surgery.  Is she to use that when she shower,or use fragrance free soap.  She stated that she has Dial but it has fragrance in it.  Informed the patient to use fragrance free soap.  Patient stated that she's having itching all over.  She said her husband said it could be her nerves.    Informed the patient it could be her nerves, but it could be from the prep used before the surgery.  Informed the patient that she could use Benadryl at night,and use an antihistamine (Allegra,Zrytec) during the day.  Patient verbalized understanding and agreed.//AB/CMA

## 2021-01-16 NOTE — Telephone Encounter (Signed)
I called patient.  She would prefer to remain on Betaseron.  She reports that she is still recovering from her breast reduction surgery last week.  She will call us if/when she decides to switch to [facility].

## 2021-01-16 NOTE — Telephone Encounter (Signed)
Patient called to find out about showering post surgery. She was told she could shower the day after her surgery but she was unsure/nervous about it. Please call to discuss. Patient also mentioned that she is having some itching all over her arms and legs and isn't sure what that is about.

## 2021-01-24 ENCOUNTER — Encounter: Payer: Self-pay | Admitting: Plastic Surgery

## 2021-01-24 ENCOUNTER — Other Ambulatory Visit: Payer: Self-pay

## 2021-01-24 ENCOUNTER — Ambulatory Visit (INDEPENDENT_AMBULATORY_CARE_PROVIDER_SITE_OTHER): Payer: 59 | Admitting: Plastic Surgery

## 2021-01-24 VITALS — BP 137/79 | HR 87

## 2021-01-24 DIAGNOSIS — N62 Hypertrophy of breast: Secondary | ICD-10-CM

## 2021-01-24 NOTE — Progress Notes (Signed)
The patient is a 65 year old female here for follow-up with her husband.  She had bilateral breast reductions done last week.  She does seem to have a little bit of a seroma possibly on both sides but the skin looks fine.  I do not think that it is worth trying to drain it at this point in time.  The stitches are intact.  The dressings are in place.  There is no sign of redness or infection.  She is very happy with the results right now.  We did take pictures with her permission and these were placed in the chart.

## 2021-02-07 ENCOUNTER — Other Ambulatory Visit: Payer: Self-pay

## 2021-02-07 ENCOUNTER — Encounter: Payer: Self-pay | Admitting: Surgical

## 2021-02-07 ENCOUNTER — Ambulatory Visit (INDEPENDENT_AMBULATORY_CARE_PROVIDER_SITE_OTHER): Payer: 59 | Admitting: Surgical

## 2021-02-07 VITALS — BP 104/72 | HR 101

## 2021-02-07 DIAGNOSIS — N62 Hypertrophy of breast: Secondary | ICD-10-CM

## 2021-02-07 DIAGNOSIS — Z9889 Other specified postprocedural states: Secondary | ICD-10-CM

## 2021-02-07 NOTE — Progress Notes (Signed)
Patient is a 65 year old female here for follow-up on her bilateral breast reduction with Dr. Marla Roe.  She is approximately 4 weeks postop.  She reports overall she is doing well.  Chaperone present on exam On exam bilateral NAC's are viable, bilateral breast incisions are intact.  Her Steri-Strips and honeycomb dressings are still present.  We removed these today to reveal intact and well-healing incisions.  She has some swelling and possible seromas, however the incisions are not very tight and there is minimal tension on the incisions.  No erythema.   Recommend wearing a more compressive garment, recommend following up in 3 to 4 weeks for reevaluation. There is no sign of infection, hematoma Recommend calling with questions or concerns.  Recommend 2 more weeks of no strenuous activities or heavy lifting.

## 2021-03-10 ENCOUNTER — Ambulatory Visit (INDEPENDENT_AMBULATORY_CARE_PROVIDER_SITE_OTHER): Payer: 59 | Admitting: Surgical

## 2021-03-10 ENCOUNTER — Other Ambulatory Visit: Payer: Self-pay

## 2021-03-10 DIAGNOSIS — Z9889 Other specified postprocedural states: Secondary | ICD-10-CM

## 2021-03-10 DIAGNOSIS — N62 Hypertrophy of breast: Secondary | ICD-10-CM

## 2021-03-10 NOTE — Progress Notes (Signed)
Patient is a 65 year old female here for follow-up after bilateral breast reduction with Dr. Marla Roe on 01/12/2021.  She is 8 weeks postop.  She reports overall she is doing really well.  She is very pleased with how things have healed.  She has some questions about restrictions.  Chaperone present on exam On exam bilateral breast incisions are intact, bilateral NAC's are viable.  No erythema noted.  No large fluid collections noted.  Swelling has improved.  Recommend continue her compressive garment/sports bra for a few more weeks.  She does not need to wear this 24/7 any longer.  Recommend avoiding bra with underwire.  She can switch to a normal bra in a few weeks.  We discussed scheduling additional follow-up or following up as needed.  Patient is comfortable following up on an as-needed basis.  There is no sign of infection, seroma, hematoma.

## 2021-05-24 ENCOUNTER — Encounter: Payer: Self-pay | Admitting: Plastic Surgery

## 2021-08-09 ENCOUNTER — Ambulatory Visit (INDEPENDENT_AMBULATORY_CARE_PROVIDER_SITE_OTHER): Payer: 59

## 2021-08-09 ENCOUNTER — Ambulatory Visit: Payer: 59 | Admitting: Podiatry

## 2021-08-09 ENCOUNTER — Other Ambulatory Visit: Payer: Self-pay

## 2021-08-09 DIAGNOSIS — M2011 Hallux valgus (acquired), right foot: Secondary | ICD-10-CM | POA: Diagnosis not present

## 2021-08-09 DIAGNOSIS — M21611 Bunion of right foot: Secondary | ICD-10-CM

## 2021-08-09 DIAGNOSIS — M21619 Bunion of unspecified foot: Secondary | ICD-10-CM

## 2021-08-09 DIAGNOSIS — M2012 Hallux valgus (acquired), left foot: Secondary | ICD-10-CM | POA: Diagnosis not present

## 2021-08-09 DIAGNOSIS — M21612 Bunion of left foot: Secondary | ICD-10-CM | POA: Diagnosis not present

## 2021-08-14 NOTE — Progress Notes (Signed)
Subjective:  Patient ID: Barbara Ellison, female    DOB: Jul 21, 1956,  MRN: [id number]  Chief Complaint  Patient presents with   Bunions    Bilateral bunions     65 y.o. female presents with the above complaint.  Patient presents with follow-up of bilateral bunion pain right worse than left side.  Patient states that they are both painful to touch.  Painful to ambulate she has been dealing with this for quite some time.  She wanted to get information as well as evaluate the bunions and think about all the options.  She has tried all the conservative treatment options she would like to discuss surgical options at this time.  She also wanted to know if she is a candidate for Lapiplasty procedure.  She denies any other acute complaints pain scale 7 out of 10 especially when rubbing on the foot.  Shoe gear modification has not helped  Review of Systems: Negative except as noted in the HPI. Denies N/V/F/Ch.  Past Medical History:  Diagnosis Date   Anxiety    Fracture of fifth metatarsal bone of right foot 04/08/2015   History of abnormal cervical Pap smear    History of adenomatous polyp of colon    2005 & 2010   Left ureteral stone    Multiple sclerosis ([place]) dx 2001 (approx.) relapsing-remitting   dr Raquel Sarna pharr--  [facility]   Neuromuscular disorder ([place])    MS   PSVT (paroxysmal supraventricular tachycardia) ([place]) 07/2017   PSVT noted on 30-day event monitor.  Several episodes of short bursts lasting less than 10 beats and up to 66 seconds.   Urgency of urination     Current Outpatient Medications:    ALPRAZolam (XANAX) 0.25 MG tablet, Take 0.25 mg by mouth as needed for anxiety., Disp: , Rfl:    atorvastatin (LIPITOR) 10 MG tablet, Take 10 mg by mouth every morning. , Disp: , Rfl:    FLUoxetine (PROZAC) 20 MG capsule, Take 1 capsule (20 mg total) by mouth daily., Disp: 90 capsule, Rfl: 1   Interferon Beta-1b (BETASERON/EXTAVIA) 0.3 MG KIT injection, Inject 0.25 mg into the skin every other  day. Qod  In the pm, Disp: 1 each, Rfl: 11   Multiple Vitamins-Minerals (WOMENS ONE DAILY PO), Take 1 tablet by mouth daily., Disp: , Rfl:    tolterodine (DETROL LA) 4 MG 24 hr capsule, Take 1 capsule (4 mg total) by mouth daily., Disp: 30 capsule, Rfl: 11   zolpidem (AMBIEN CR) 12.5 MG CR tablet, Take 12.5 mg by mouth at bedtime as needed for sleep (sleep). , Disp: , Rfl:   Social History   Tobacco Use  Smoking Status Never  Smokeless Tobacco Never    No Known Allergies Objective:  There were no vitals filed for this visit. There is no height or weight on file to calculate BMI. Constitutional Well developed. Well nourished.  Vascular Dorsalis pedis pulses palpable bilaterally. Posterior tibial pulses palpable bilaterally. Capillary refill normal to all digits.  No cyanosis or clubbing noted. Pedal hair growth normal.  Neurologic Normal speech. Oriented to person, place, and time. Epicritic sensation to light touch grossly present bilaterally.  Dermatologic Nails well groomed and normal in appearance. No open wounds. No skin lesions.  Orthopedic: Normal joint ROM without pain or crepitus bilaterally. Hallux abductovalgus deformity present bilateral right greater than left side.  Severe deformity noted on the right side moderate deformity noted on the left side.  No intra-articular pain noted to both first  MPJs.  No deep intra-articular pain noted.  This is a track bound due to deformity not a tracking deformity Left 1st MPJ diminished range of motion. Left 1st TMT without gross hypermobility. Right 1st MPJ diminished range of motion  Right 1st TMT with gross hypermobility. Lesser digital contractures absent bilaterally.   Radiographs: Taken and reviewed. Hallux abductovalgus deformity present. Metatarsal parabola normal. 1st/2nd IMA: Severe on the right foot and moderate on the left side.  Sesamoid position 5 and 7.  There is increasing hallux valgus angle.  No first MPJ  arthritis notedPrevious hardware is intact no signs of backing out or loosening noted.  Assessment:   1. Hallux abducto valgus, right   2. Hallux abducto valgus, left    Plan:  Patient was evaluated and treated and all questions answered.  Hallux abductovalgus deformity, right greater than left side with moderate bunion deformity on the left and severe bunion deformity on the right -XR as above. -I discussed all the surgical options that are available.  She would benefit from a right side Lapiplasty procedure and left side head osteotomy.  I discussed this with the patient in extensive detail she states understanding and would like to think about the surgical options and revisit when she is ready for surgery.  I discussed the postop protocol in extensive detail with the patient as well.  No follow-ups on file.

## 2021-11-27 ENCOUNTER — Ambulatory Visit: Payer: 59 | Admitting: Cardiology

## 2021-11-29 ENCOUNTER — Telehealth: Payer: Self-pay | Admitting: *Deleted

## 2021-11-29 NOTE — Telephone Encounter (Signed)
Submitted PA Betaseron via fax to [facility] at [phone]. Received fax confirmation, waiting on determination.

## 2021-12-04 NOTE — Telephone Encounter (Signed)
Called [facility] to check on status of PA at [phone]. Spoke w/ Baldo Ash. PA approved 11/30/21-11/30/22. PA# [id number].

## 2021-12-12 ENCOUNTER — Other Ambulatory Visit: Payer: Self-pay | Admitting: *Deleted

## 2021-12-12 DIAGNOSIS — G35 Multiple sclerosis: Secondary | ICD-10-CM

## 2021-12-12 MED ORDER — INTERFERON BETA-1B 0.3 MG ~~LOC~~ KIT
0.2500 mg | PACK | SUBCUTANEOUS | 1 refills | Status: DC
Start: 2021-12-12 — End: 2022-01-23

## 2021-12-25 ENCOUNTER — Ambulatory Visit: Payer: 59 | Admitting: Cardiology

## 2022-01-04 DIAGNOSIS — I340 Nonrheumatic mitral (valve) insufficiency: Secondary | ICD-10-CM | POA: Insufficient documentation

## 2022-01-04 NOTE — Progress Notes (Signed)
?  ?Cardiology Office Note ? ? ?Date:  01/06/2022  ? ?ID:  Barbara Ellison, DOB Mar 26, 1956, MRN [id number] ? ?PCP:  Leighton Ruff, MD (Inactive)  ?Cardiologist:   None ?Referring:  Leighton Ruff, MD (Inactive) ? ?Chief Complaint  ?Patient presents with  ? Palpitations  ? ? ?  ?History of Present Illness: ?Barbara Ellison is a 66 y.o. female who was referred by Leighton Ruff, MD (Inactive) for evaluation of palpitations.  Echo in 2014 demonstrated an EF of 55- 60%.  There was mild MR. He saw Dr. Ellyn Hack in 2018.  She had worn a monitor demonstrating runs of SVT.  These were short events.  It was decided to manage her without medications. ? ?She called to schedule follow-up because she was having more palpitations.  This was a couple months ago when she had to reschedule.  She said that in the last couple of weeks it actually has gotten better.  She said that around November or December she was having increased palpitations similar to her what she had years ago.  This would come sporadically.  She would feel anxious with this.  She was not describing presyncope or syncope.  She was not able to take her heart rate.  She did not have any chest pressure, neck or arm discomfort.  They would come and go spontaneously.  She might just start breathing deeply to get rid of it.  She did not have any new shortness of breath, PND or orthopnea.  She is otherwise been feeling okay. ? ?She had not been doing as much recently because she hurt her knee but she is active around her house.  She does not bring on any symptoms with this. ? ? ?Past Medical History:  ?Diagnosis Date  ? Anxiety   ? Fracture of fifth metatarsal bone of right foot 04/08/2015  ? History of abnormal cervical Pap smear   ? History of adenomatous polyp of colon   ? 2005 & 2010  ? Left ureteral stone   ? Multiple sclerosis ([place]) dx 2001 (approx.) relapsing-remitting  ? [facility]  ? Neuromuscular disorder ([place])   ? MS  ? PSVT (paroxysmal  supraventricular tachycardia) ([place]) 07/2017  ? PSVT noted on 30-day event monitor.  Several episodes of short bursts lasting less than 10 beats and up to 66 seconds.  ? Urgency of urination   ? ? ?Past Surgical History:  ?Procedure Laterality Date  ? BREAST REDUCTION SURGERY Bilateral 01/12/2021  ? Procedure: MAMMARY REDUCTION  (BREAST);  Surgeon: Wallace Going, DO;  Location: [facility];  Service: Plastics;  Laterality: Bilateral;  ? COLONOSCOPY  last one 2015  ? EXCISION SOFT TISSUE MASS, RIGHT CHEST WALL  02-26-2013  ? ORIF TOE FRACTURE Right 04/08/2015  ? Procedure: OPEN REDUCTION INTERNAL FIXATION (ORIF) RIGHT METATARSAL (TOE) FRACTURE;  Surgeon: Marchia Bond, MD;  Location: [facility];  Service: Orthopedics;  Laterality: Right;  ? TRANSTHORACIC ECHOCARDIOGRAM  12-24-2012  ? Ventricular septum motion showed paradox,  ef 55-60%,   mild MR,  trivial PR and TR  ? ? ? ?Current Outpatient Medications  ?Medication Sig Dispense Refill  ? ALPRAZolam (XANAX) 0.25 MG tablet Take 0.25 mg by mouth as needed for anxiety.    ? atorvastatin (LIPITOR) 10 MG tablet Take 10 mg by mouth every morning.     ? clonazePAM (KLONOPIN) 0.5 MG tablet Take by mouth.    ? FLUoxetine (PROZAC) 20 MG capsule Take 1  capsule (20 mg total) by mouth daily. 90 capsule 1  ? Interferon Beta-1b (BETASERON/EXTAVIA) 0.3 MG KIT injection Inject 0.25 mg into the skin every other day. Qod  In the pm. MUST CALL [phone] to schedule follow up for ongoing refills 14 each 1  ? meloxicam (MOBIC) 15 MG tablet Take 15 mg by mouth daily as needed.    ? Multiple Vitamins-Minerals (WOMENS ONE DAILY PO) Take 1 tablet by mouth daily.    ? zolpidem (AMBIEN CR) 12.5 MG CR tablet Take 12.5 mg by mouth at bedtime as needed for sleep (sleep).     ? tolterodine (DETROL LA) 4 MG 24 hr capsule Take 1 capsule (4 mg total) by mouth daily. 30 capsule 11  ? ?No current facility-administered medications for this visit.  ? ? ?Allergies:    Patient has no known allergies.  ? ? ?Social History:  The patient  reports that she has never smoked. She has never used smokeless tobacco. She reports current alcohol use. She reports that she does not use drugs.  ? ?Family History:  The patient's family history includes Cancer in her brother and mother; Diabetes in her brother and paternal aunt; Diabetes Mellitus II in her brother; Heart attack in her father; Heart disease (age of onset: 88) in her father; High Cholesterol in her brother and brother; Hypertension in her brother and brother; Stroke in her maternal aunt.  ? ? ?ROS:  Please see the history of present illness.   Otherwise, review of systems are positive for none.   All other systems are reviewed and negative.  ? ? ?PHYSICAL EXAM: ?VS:  BP 125/84   Pulse 74   Ht '5\' 6"'  (1.676 m)   Wt 159 lb 12.8 oz (72.5 kg)   SpO2 98%   BMI 25.79 kg/m?  , BMI Body mass index is 25.79 kg/m?. ?GENERAL:  Well appearing ?HEENT:  Pupils equal round and reactive, fundi not visualized, oral mucosa unremarkable ?NECK:  No jugular venous distention, waveform within normal limits, carotid upstroke brisk and symmetric, no bruits, no thyromegaly ?LYMPHATICS:  No cervical, inguinal adenopathy ?LUNGS:  Clear to auscultation bilaterally ?BACK:  No CVA tenderness ?CHEST:  Unremarkable ?HEART:  PMI not displaced or sustained,S1 and S2 within normal limits, no S3, no S4, no clicks, no rubs, no murmurs ?ABD:  Flat, positive bowel sounds normal in frequency in pitch, no bruits, no rebound, no guarding, no midline pulsatile mass, no hepatomegaly, no splenomegaly ?EXT:  2 plus pulses throughout, no edema, no cyanosis no clubbing ?SKIN:  No rashes no nodules ?NEURO:  Cranial nerves II through XII grossly intact, motor grossly intact throughout ?PSYCH:  Cognitively intact, oriented to person place and time ? ? ? ?EKG:  EKG is ordered today. ?The ekg ordered today demonstrates sinus rhythm, rate 74, interventricular conduction delay,  left axis deviation, unchanged from EKG dating back to 2014. ? ? ?Recent Labs: ?No results found for requested labs within last 8760 hours.  ? ? ?Lipid Panel ?No results found for: CHOL, TRIG, HDL, CHOLHDL, VLDL, LDLCALC, LDLDIRECT ?  ? ?Wt Readings from Last 3 Encounters:  ?01/05/22 159 lb 12.8 oz (72.5 kg)  ?01/12/21 162 lb 4.1 oz (73.6 kg)  ?01/03/21 162 lb 8 oz (73.7 kg)  ?  ? ? ?Other studies Reviewed: ?Additional studies/ records that were reviewed today include: Previous EKGs, labs, primary care office notes.Marland Kitchen ?Review of the above records demonstrates:  Please see elsewhere in the note.   ? ? ? ?ASSESSMENT  AND PLAN: ? ? ?Palpitations: These are not particularly problematic now.  I talked to her about getting a Fitbit.  I do see from her primary care office notes that she had blood work recently to include a TSH which was normal.  Electrolytes were normal.  She was not anemic.  I do not think that these are frequent enough to necessitate a monitor but she will call me back if they increase in frequency and intensity.  We did talk about vagal maneuvers. ? ?MR:  This was mild in 2014.  I would like to follow this with an echocardiogram. ? ?Abnormal EKG: I did go back and look and see what this was present for almost a decade.  Work-up will be as above.  Otherwise I discussed this with her and no further work-up. ? ?Current medicines are reviewed at length with the patient today.  The patient does not have concerns regarding medicines. ? ?The following changes have been made:  no change ? ?Labs/ tests ordered today include:  ? ?Orders Placed This Encounter  ?Procedures  ? EKG 12-Lead  ? ECHOCARDIOGRAM COMPLETE  ? ? ? ?Disposition:   FU with me as needed based on the results of the above ?Follow-up symptoms. ? ? ?Signed, ?Minus Breeding, MD  ?01/06/2022 2:00 PM    ?[place] ? ? ? ?

## 2022-01-05 ENCOUNTER — Encounter: Payer: Self-pay | Admitting: Cardiology

## 2022-01-05 ENCOUNTER — Other Ambulatory Visit: Payer: Self-pay

## 2022-01-05 ENCOUNTER — Ambulatory Visit (INDEPENDENT_AMBULATORY_CARE_PROVIDER_SITE_OTHER): Payer: 59 | Admitting: Cardiology

## 2022-01-05 VITALS — BP 125/84 | HR 74 | Ht 66.0 in | Wt 159.8 lb

## 2022-01-05 DIAGNOSIS — I340 Nonrheumatic mitral (valve) insufficiency: Secondary | ICD-10-CM | POA: Diagnosis not present

## 2022-01-05 DIAGNOSIS — R002 Palpitations: Secondary | ICD-10-CM | POA: Diagnosis not present

## 2022-01-05 NOTE — Patient Instructions (Addendum)
Medication Instructions:  ?Your physician recommends that you continue on your current medications as directed. Please refer to the Current Medication list given to you today.  ? ?Labwork: ?NONE ? ?Testing/Procedures: ?Your physician has requested that you have an echocardiogram. Echocardiography is a painless test that uses sound waves to create images of your heart. It provides your doctor with information about the size and shape of your heart and how well your heart?s chambers and valves are working. This procedure takes approximately one hour. There are no restrictions for this procedure.  ? ?Follow-Up: ?AS NEEDED  ? ?CONSIDER PURCHASING A FITBIT  ?  ?

## 2022-01-06 ENCOUNTER — Encounter: Payer: Self-pay | Admitting: Cardiology

## 2022-01-17 ENCOUNTER — Encounter: Payer: Self-pay | Admitting: Cardiology

## 2022-01-17 ENCOUNTER — Ambulatory Visit (HOSPITAL_COMMUNITY): Payer: 59 | Attending: Internal Medicine

## 2022-01-17 NOTE — Progress Notes (Unsigned)
Patient ID: Barbara Ellison, female   DOB: 1955-11-15, 66 y.o.   MRN: [id number] ? ?Verified appointment "no show" status with Pearline Cables at 2:17pm. ? ?

## 2022-01-23 ENCOUNTER — Ambulatory Visit (HOSPITAL_COMMUNITY): Payer: 59 | Attending: Internal Medicine

## 2022-01-23 ENCOUNTER — Other Ambulatory Visit: Payer: Self-pay

## 2022-01-23 ENCOUNTER — Other Ambulatory Visit: Payer: Self-pay | Admitting: Neurology

## 2022-01-23 DIAGNOSIS — G35 Multiple sclerosis: Secondary | ICD-10-CM

## 2022-01-23 DIAGNOSIS — I340 Nonrheumatic mitral (valve) insufficiency: Secondary | ICD-10-CM | POA: Insufficient documentation

## 2022-01-23 DIAGNOSIS — R002 Palpitations: Secondary | ICD-10-CM | POA: Insufficient documentation

## 2022-01-23 LAB — ECHOCARDIOGRAM COMPLETE
Area-P 1/2: 4.1 cm2
S' Lateral: 2.5 cm

## 2022-01-29 ENCOUNTER — Encounter: Payer: Self-pay | Admitting: *Deleted

## 2022-05-11 ENCOUNTER — Other Ambulatory Visit: Payer: Self-pay | Admitting: Neurology

## 2022-05-11 DIAGNOSIS — G35 Multiple sclerosis: Secondary | ICD-10-CM

## 2022-05-14 ENCOUNTER — Other Ambulatory Visit: Payer: Self-pay | Admitting: Neurology

## 2022-05-14 DIAGNOSIS — G35 Multiple sclerosis: Secondary | ICD-10-CM

## 2022-05-15 ENCOUNTER — Other Ambulatory Visit: Payer: Self-pay | Admitting: Neurology

## 2022-05-15 DIAGNOSIS — G35 Multiple sclerosis: Secondary | ICD-10-CM

## 2022-05-31 ENCOUNTER — Other Ambulatory Visit: Payer: Self-pay | Admitting: Neurology

## 2022-05-31 DIAGNOSIS — G35 Multiple sclerosis: Secondary | ICD-10-CM

## 2022-06-27 ENCOUNTER — Other Ambulatory Visit: Payer: Self-pay | Admitting: Neurology

## 2022-06-27 DIAGNOSIS — G35 Multiple sclerosis: Secondary | ICD-10-CM

## 2022-07-09 ENCOUNTER — Encounter: Payer: Self-pay | Admitting: *Deleted

## 2022-07-09 ENCOUNTER — Telehealth: Payer: Self-pay | Admitting: Neurology

## 2022-07-09 ENCOUNTER — Telehealth: Payer: Self-pay | Admitting: *Deleted

## 2022-07-09 NOTE — Telephone Encounter (Signed)
Called pt back. Last seen 01/03/21. Advised she will need appt first before being able to refill meds/fill out ppw. Scheduled work in visit 07/11/22 at 1030am with Dr. Felecia Shelling. She will bring updated insurance cards to appt and will check in at 10am. I reminded her to make sure to make 6 month f/u before she leaves at check out on Wed.

## 2022-07-09 NOTE — Telephone Encounter (Signed)
Pt is calling. Requesting Dr. Felecia Shelling gives her a call. Pt stated her insurance has change and she is trying to figure out how to stay on medication.

## 2022-07-09 NOTE — Telephone Encounter (Signed)
error 

## 2022-07-11 ENCOUNTER — Telehealth: Payer: Self-pay | Admitting: Neurology

## 2022-07-11 ENCOUNTER — Ambulatory Visit: Payer: Medicare Other | Admitting: Neurology

## 2022-07-11 ENCOUNTER — Encounter: Payer: Self-pay | Admitting: Neurology

## 2022-07-11 VITALS — BP 121/74 | HR 93 | Ht 66.0 in | Wt 159.6 lb

## 2022-07-11 DIAGNOSIS — R3915 Urgency of urination: Secondary | ICD-10-CM | POA: Diagnosis not present

## 2022-07-11 DIAGNOSIS — E559 Vitamin D deficiency, unspecified: Secondary | ICD-10-CM | POA: Diagnosis not present

## 2022-07-11 DIAGNOSIS — Z79899 Other long term (current) drug therapy: Secondary | ICD-10-CM | POA: Diagnosis not present

## 2022-07-11 DIAGNOSIS — G35 Multiple sclerosis: Secondary | ICD-10-CM

## 2022-07-11 MED ORDER — OXYBUTYNIN CHLORIDE ER 10 MG PO TB24: 10.0000 mg | ORAL_TABLET | Freq: Every day | ORAL | 11 refills | Status: DC

## 2022-07-11 NOTE — Telephone Encounter (Signed)
[employer] medicare NPR sent to GI 

## 2022-07-11 NOTE — Progress Notes (Signed)
[facility]  PATIENT: Barbara Ellison DOB: 1958/09/23  REFERRING DOCTOR OR PCP: Leighton Ruff, MD SOURCE: Patient, notes from Dr. Drema Dallas, notes from [facility] neurology, multiple imaging reports  _________________________________   HISTORICAL  CHIEF COMPLAINT:  Chief Complaint  Patient presents with   Follow-up    Pt in Rm #16 and alone. Pt state her face and lips gets tingling at random times. Pt state she has some weakness in her left leg while excising.    HISTORY OF PRESENT ILLNESS:  Barbara Ellison is a 66 y.o. woman with  multiple sclerosis.  Update 07/11/2022: She feels she is doing about the same for the most part but has had some facial tingling and left leg seems slightly weaker.   No definite exacerbations .   She is on Betaseron (since 2004) and tolerates it fairly well.    She denies difficulty with gait or balance and can go up/down strairs without the bannister.    Left leg sometimes feels weak or tires out easily.    Her vision is fine.    She has noticed urinary urgency with rare incontinence.   She was once on a bladder medication but stopped since symptoms were milder.    She denies burning.      She denies much fatigue unless she has a bad night's sleep.  She sleeps ok but sometimes needs to takes Ambien or xanax.   She denies depression .  She has some anxiety and mood swings at times.   She notes short term memory and focus/attention is reduced.       MS History: She was diagnosed with MS in 2004 by Dr. Erling Cruz.  At that time, she presented with numbness and tingling.  She had a Lhermitte sign when she bent her neck.  He had MRIs of the brain and cervical spine.  The MRI of the brain showed a couple T2/FLAIR hyperintense foci including 1 in the corpus callosum that enhanced after contrast.  The MRI of the cervical spine showed 3 lesions, more to the left.  She has not had any exacerbations since the initial presentation.  She was placed on  Betaseron.  She continues on that medication and tolerates it well.  She started to see Dr. Dellis Filbert at [facility].  When he moved away she began to see Dr. Shelia Media.   IMAGING MRI of the brain 07/27/2020 showed T2/FLAIR hyperintense foci predominantly in the periventricular white matter in a pattern and configuration consistent with chronic demyelinating plaque associated with multiple sclerosis.  None of the foci appear to be acute  MRI of the cervical spine shows Two T2 hyperintense foci within the spinal cord towards the right adjacent to C4 and C7-T1.  By report, both of these were present in 2004.  There do not appear to be any new lesion.    Multilevel degenerative changes at C3-C4 through C5-C6 as detailed above that do not lead to nerve root compression.  There is borderline spinal stenosis at C5-C6.  MRI of the brain 12/02/2013 showed a few periventricular and deep white matter white matter foci consistent with MS.  No enhancing lesions or change compared to 2012.  MRI of the brain 01/09/2011 showed a few periventricular and deep white matter foci consistent with MS.  MRI report of the brain 11/04/2002 described several white matter foci including 1 in the left corpus callosum that enhanced after contrast.  MRI report 11/04/2002 described foci within the spinal cord to  the left at C3-C4, C4-C5 and C7-T1 they do not enhance.  REVIEW OF SYSTEMS: Constitutional: No fevers, chills, sweats, or change in appetite Eyes: No visual changes, double vision, eye pain Ear, nose and throat: No hearing loss, ear pain, nasal congestion, sore throat Cardiovascular: No chest pain, palpitations Respiratory:  No shortness of breath at rest or with exertion.   No wheezes GastrointestinaI: No nausea, vomiting, diarrhea, abdominal pain, fecal incontinence Genitourinary:  No dysuria, urinary retention or frequency.  No nocturia. Musculoskeletal:  No neck pain, back pain Integumentary: No rash, pruritus, skin  lesions Neurological: as above Psychiatric: No depression at this time.  No anxiety Endocrine: No palpitations, diaphoresis, change in appetite, change in weigh or increased thirst Hematologic/Lymphatic:  No anemia, purpura, petechiae. Allergic/Immunologic: No itchy/runny eyes, nasal congestion, recent allergic reactions, rashes  ALLERGIES: No Known Allergies  HOME MEDICATIONS:  Current Outpatient Medications:    ALPRAZolam (XANAX) 0.25 MG tablet, Take 0.25 mg by mouth as needed for anxiety., Disp: , Rfl:    atorvastatin (LIPITOR) 10 MG tablet, Take 10 mg by mouth every morning. , Disp: , Rfl:    BETASERON 0.3 MG KIT injection, ADD 1.2 ML DILUENT TO VIAL AND MIX GENTLY. INJECT 1 ML SUBCUTANEOUSLY EVERY OTHER DAY. USE WITHIN 3 HOURS OF MIXING. STORE AT ROOM TEMPERATURE., Disp: 14 kit, Rfl: 0   FLUoxetine (PROZAC) 20 MG capsule, Take 1 capsule (20 mg total) by mouth daily., Disp: 90 capsule, Rfl: 1   meloxicam (MOBIC) 15 MG tablet, Take 15 mg by mouth daily as needed., Disp: , Rfl:    Multiple Vitamins-Minerals (WOMENS ONE DAILY PO), Take 1 tablet by mouth daily., Disp: , Rfl:    oxybutynin (DITROPAN XL) 10 MG 24 hr tablet, Take 1 tablet (10 mg total) by mouth at bedtime., Disp: 30 tablet, Rfl: 11   zolpidem (AMBIEN CR) 12.5 MG CR tablet, Take 12.5 mg by mouth at bedtime as needed for sleep (sleep). , Disp: , Rfl:    clonazePAM (KLONOPIN) 0.5 MG tablet, Take by mouth., Disp: , Rfl:   PAST MEDICAL HISTORY: Past Medical History:  Diagnosis Date   Anxiety    Fracture of fifth metatarsal bone of right foot 04/08/2015   History of abnormal cervical Pap smear    History of adenomatous polyp of colon    2005 & 2010   Left ureteral stone    Multiple sclerosis ([place]) dx 2001 (approx.) relapsing-remitting   dr [facility]--  [facility]   Neuromuscular disorder ([place])    MS   PSVT (paroxysmal supraventricular tachycardia) ([place]) 07/2017   PSVT noted on 30-day event monitor.  Several episodes of short  bursts lasting less than 10 beats and up to 66 seconds.   Urgency of urination     PAST SURGICAL HISTORY: Past Surgical History:  Procedure Laterality Date   BREAST REDUCTION SURGERY Bilateral 01/12/2021   Procedure: MAMMARY REDUCTION  (BREAST);  Surgeon: Wallace Going, DO;  Location: [facility];  Service: Plastics;  Laterality: Bilateral;   COLONOSCOPY  last one 2015   EXCISION SOFT TISSUE MASS, RIGHT CHEST WALL  02-26-2013   ORIF TOE FRACTURE Right 04/08/2015   Procedure: OPEN REDUCTION INTERNAL FIXATION (ORIF) RIGHT METATARSAL (TOE) FRACTURE;  Surgeon: Marchia Bond, MD;  Location: [facility];  Service: Orthopedics;  Laterality: Right;   TRANSTHORACIC ECHOCARDIOGRAM  12-24-2012   Ventricular septum motion showed paradox,  ef 55-60%,   mild MR,  trivial PR and TR    FAMILY HISTORY:  Family History  Problem Relation Age of Onset   Cancer Mother        Uterine ca   Heart disease Father 30       Fatal heart attack -> cardiac arrest   Heart attack Father    Hypertension Brother    High Cholesterol Brother    Diabetes Brother    Cancer Brother    Hypertension Brother    Diabetes Mellitus II Brother    High Cholesterol Brother    Stroke Maternal Aunt    Diabetes Paternal Aunt     SOCIAL HISTORY:  Social History   Socioeconomic History   Marital status: Married    Spouse name: Teyana Pierron   Number of children: 0   Years of education: Not on file   Highest education level: Not on file  Occupational History   Occupation: retired    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  Tobacco Use   Smoking status: Never   Smokeless tobacco: Never  Substance and Sexual Activity   Alcohol use: Yes    Comment: 3-4 glasses of wine/week   Drug use: No   Sexual activity: Not on file  Other Topics Concern   Not on file  Social History Narrative   Right handed   No caffeine use   Social Determinants of Health   Financial Resource Strain: Not on file   Food Insecurity: Not on file  Transportation Needs: Not on file  Physical Activity: Not on file  Stress: Not on file  Social Connections: Not on file  Intimate Partner Violence: Not on file     PHYSICAL EXAM  Vitals:   07/11/22 1036  BP: 121/74  Pulse: 93  Weight: 159 lb 9.6 oz (72.4 kg)  Height: _0  (1.676 m)    Body mass index is 25.76 kg/m.   General: The patient is well-developed and well-nourished and in no acute distress  HEENT:  Head is [place]/AT.  Sclera are anicteric.   Skin: Extremities are without rash or  edema.   Neurologic Exam  Mental status: The patient is alert and oriented x 3 at the time of the examination. The patient has apparent normal recent and remote memory, with an apparently normal attention span and concentration ability.   Speech is normal.  Cranial nerves: Extraocular movements are full.  There is good facial sensation to soft touch bilaterally.Facial strength is normal.  Trapezius and sternocleidomastoid strength is normal. No dysarthria is noted.  The tongue is midline, and the patient has symmetric elevation of the soft palate. No obvious hearing deficits are noted.  Motor:  Muscle bulk is normal.   Tone is normal. Strength is  5 / 5 in all 4 extremities except 4+/5 left foot.   Sensory: Sensory testing is intact to pinprick, soft touch and vibration sensation in all 4 extremities.  Coordination: Cerebellar testing reveals good finger-nose-finger and heel-to-shin bilaterally.  Gait and station: Station is normal.   Gait is fairly normal.  The tandem gait is wide.  Romberg is negative.   Reflexes: Deep tendon reflexes are symmetric and normal bilaterally.      ASSESSMENT AND PLAN  Multiple sclerosis ([place]) - Plan: MR BRAIN WO CONTRAST  High risk medication use  Urinary urgency  Vitamin D deficiency   1.  We will fill out patient assistance form for Betaseron.   Check MRI brain to see if breakthrough activity and consider a  different DMT if occurring.  We also dicussed if no change over  time, we could also one day stop the Betaseron.   2.   Oxybutynin for bladder 3.    rtc 6 months, sooner if new or worsening issues.     Qunicy Higinbotham A. Felecia Shelling, MD, [facility] 8/61/4830, 7:35 PM Certified in Neurology, Clinical Neurophysiology, Sleep Medicine and Neuroimaging  [facility] [address], [place], [place] [address] [phone]

## 2022-07-12 NOTE — Progress Notes (Signed)
Faxed completed/signed [employer] form for Betaseron pt assistance to [phone]. Received fax confirmation.

## 2022-07-24 ENCOUNTER — Other Ambulatory Visit: Payer: 59

## 2022-07-25 ENCOUNTER — Telehealth: Payer: Self-pay | Admitting: Neurology

## 2022-07-25 NOTE — Telephone Encounter (Signed)
Called and spoke w/ [place] They did not receive fax we sent 07/12/22 with completed prescriber portion (page 6). I refaxed to them at [phone]. Received fax confirmation. They also needed pt Medicare ID. I provided them: [id number]

## 2022-07-25 NOTE — Telephone Encounter (Addendum)
[employer] [place] [employer] [place]) calling to follow up on the form and inform how to obtain the application for BETASERON 0.3 MG KIT injection. Have had her application for a month. Want to make sure patient get medication Would like a call from the nurse.   Fax: [phone]

## 2022-08-03 ENCOUNTER — Ambulatory Visit
Admission: RE | Admit: 2022-08-03 | Discharge: 2022-08-03 | Disposition: A | Discharge status: Home / Self Care | Payer: Medicare Other | Source: Ambulatory Visit | Attending: Neurology | Admitting: Neurology

## 2022-08-03 DIAGNOSIS — G35 Multiple sclerosis: Secondary | ICD-10-CM

## 2022-08-14 ENCOUNTER — Other Ambulatory Visit: Payer: Self-pay | Admitting: Family Medicine

## 2022-08-14 DIAGNOSIS — R109 Unspecified abdominal pain: Secondary | ICD-10-CM

## 2022-08-19 ENCOUNTER — Encounter: Payer: Self-pay | Admitting: Neurology

## 2022-08-23 ENCOUNTER — Other Ambulatory Visit: Payer: Medicare Other

## 2022-08-23 ENCOUNTER — Ambulatory Visit
Admission: RE | Admit: 2022-08-23 | Discharge: 2022-08-23 | Disposition: A | Discharge status: Home / Self Care | Payer: Medicare Other | Source: Ambulatory Visit | Attending: Family Medicine | Admitting: Family Medicine

## 2022-08-23 DIAGNOSIS — R109 Unspecified abdominal pain: Secondary | ICD-10-CM

## 2022-08-23 MED ORDER — IOPAMIDOL (ISOVUE-300) INJECTION 61%
100.0000 mL | Freq: Once | INTRAVENOUS | Status: AC | PRN
  Administered 2022-08-23: 100 mL via INTRAVENOUS

## 2022-09-24 ENCOUNTER — Encounter: Payer: Self-pay | Admitting: *Deleted

## 2022-10-03 DIAGNOSIS — B359 Dermatophytosis, unspecified: Secondary | ICD-10-CM | POA: Diagnosis not present

## 2022-10-09 NOTE — Telephone Encounter (Signed)
Received fax from [employer] [place] [employer] that pt is eligible to receive Betaseron at no cost for an enrollment of 10/29/22-10/29/23. Case number: [id number].

## 2022-11-13 DIAGNOSIS — D696 Thrombocytopenia, unspecified: Secondary | ICD-10-CM | POA: Diagnosis not present

## 2022-11-13 DIAGNOSIS — R7303 Prediabetes: Secondary | ICD-10-CM | POA: Diagnosis not present

## 2022-11-13 DIAGNOSIS — R9431 Abnormal electrocardiogram [ECG] [EKG]: Secondary | ICD-10-CM | POA: Diagnosis not present

## 2022-11-13 DIAGNOSIS — E785 Hyperlipidemia, unspecified: Secondary | ICD-10-CM | POA: Diagnosis not present

## 2022-11-13 DIAGNOSIS — Z0000 Encounter for general adult medical examination without abnormal findings: Secondary | ICD-10-CM | POA: Diagnosis not present

## 2022-11-13 DIAGNOSIS — I471 Supraventricular tachycardia, unspecified: Secondary | ICD-10-CM | POA: Diagnosis not present

## 2022-11-13 DIAGNOSIS — G4700 Insomnia, unspecified: Secondary | ICD-10-CM | POA: Diagnosis not present

## 2022-11-13 DIAGNOSIS — G35 Multiple sclerosis: Secondary | ICD-10-CM | POA: Diagnosis not present

## 2022-11-13 DIAGNOSIS — I444 Left anterior fascicular block: Secondary | ICD-10-CM | POA: Diagnosis not present

## 2022-11-13 DIAGNOSIS — R1032 Left lower quadrant pain: Secondary | ICD-10-CM | POA: Diagnosis not present

## 2022-12-06 DIAGNOSIS — R7401 Elevation of levels of liver transaminase levels: Secondary | ICD-10-CM | POA: Diagnosis not present

## 2022-12-06 DIAGNOSIS — R1032 Left lower quadrant pain: Secondary | ICD-10-CM | POA: Diagnosis not present

## 2023-01-16 NOTE — Progress Notes (Deleted)
[facility]  PATIENT: Barbara Ellison DOB: 10-04-1956  REFERRING DOCTOR OR PCP: Leighton Ruff, MD SOURCE: Patient, notes from Dr. Drema Dallas, notes from [facility] neurology, multiple imaging reports  _________________________________   HISTORICAL  CHIEF COMPLAINT:  No chief complaint on file.   HISTORY OF PRESENT ILLNESS:  Barbara Ellison is a 67 y.o. woman with  multiple sclerosis.  Update 07/11/2022: She feels she is doing about the same for the most part but has had some facial tingling and left leg seems slightly weaker.   No definite exacerbations .   She is on Betaseron (since 2004) and tolerates it fairly well.    She denies difficulty with gait or balance and can go up/down strairs without the bannister.    Left leg sometimes feels weak or tires out easily.    Her vision is fine.    She has noticed urinary urgency with rare incontinence.   She was once on a bladder medication but stopped since symptoms were milder.    She denies burning.      She denies much fatigue unless she has a bad night's sleep.  She sleeps ok but sometimes needs to takes Ambien or xanax.   She denies depression .  She has some anxiety and mood swings at times.   She notes short term memory and focus/attention is reduced.       MS History: She was diagnosed with MS in 2004 by Dr. Erling Cruz.  At that time, she presented with numbness and tingling.  She had a Lhermitte sign when she bent her neck.  He had MRIs of the brain and cervical spine.  The MRI of the brain showed a couple T2/FLAIR hyperintense foci including 1 in the corpus callosum that enhanced after contrast.  The MRI of the cervical spine showed 3 lesions, more to the left.  She has not had any exacerbations since the initial presentation.  She was placed on Betaseron.  She continues on that medication and tolerates it well.  She started to see Dr. Dellis Filbert at [facility].  When he moved away she began to see Dr. Shelia Media.    IMAGING MRI of the brain 07/27/2020 showed T2/FLAIR hyperintense foci predominantly in the periventricular white matter in a pattern and configuration consistent with chronic demyelinating plaque associated with multiple sclerosis.  None of the foci appear to be acute  MRI of the cervical spine shows Two T2 hyperintense foci within the spinal cord towards the right adjacent to C4 and C7-T1.  By report, both of these were present in 2004.  There do not appear to be any new lesion.    Multilevel degenerative changes at C3-C4 through C5-C6 as detailed above that do not lead to nerve root compression.  There is borderline spinal stenosis at C5-C6.  MRI of the brain 12/02/2013 showed a few periventricular and deep white matter white matter foci consistent with MS.  No enhancing lesions or change compared to 2012.  MRI of the brain 01/09/2011 showed a few periventricular and deep white matter foci consistent with MS.  MRI report of the brain 11/04/2002 described several white matter foci including 1 in the left corpus callosum that enhanced after contrast.  MRI report 11/04/2002 described foci within the spinal cord to the left at C3-C4, C4-C5 and C7-T1 they do not enhance.  REVIEW OF SYSTEMS: Constitutional: No fevers, chills, sweats, or change in appetite Eyes: No visual changes, double vision, eye pain Ear, nose and throat: No  hearing loss, ear pain, nasal congestion, sore throat Cardiovascular: No chest pain, palpitations Respiratory:  No shortness of breath at rest or with exertion.   No wheezes GastrointestinaI: No nausea, vomiting, diarrhea, abdominal pain, fecal incontinence Genitourinary:  No dysuria, urinary retention or frequency.  No nocturia. Musculoskeletal:  No neck pain, back pain Integumentary: No rash, pruritus, skin lesions Neurological: as above Psychiatric: No depression at this time.  No anxiety Endocrine: No palpitations, diaphoresis, change in appetite, change in weigh or  increased thirst Hematologic/Lymphatic:  No anemia, purpura, petechiae. Allergic/Immunologic: No itchy/runny eyes, nasal congestion, recent allergic reactions, rashes  ALLERGIES: No Known Allergies  HOME MEDICATIONS:  Current Outpatient Medications:    ALPRAZolam (XANAX) 0.25 MG tablet, Take 0.25 mg by mouth as needed for anxiety., Disp: , Rfl:    atorvastatin (LIPITOR) 10 MG tablet, Take 10 mg by mouth every morning. , Disp: , Rfl:    BETASERON 0.3 MG KIT injection, ADD 1.2 ML DILUENT TO VIAL AND MIX GENTLY. INJECT 1 ML SUBCUTANEOUSLY EVERY OTHER DAY. USE WITHIN 3 HOURS OF MIXING. STORE AT ROOM TEMPERATURE., Disp: 14 kit, Rfl: 0   FLUoxetine (PROZAC) 20 MG capsule, Take 1 capsule (20 mg total) by mouth daily., Disp: 90 capsule, Rfl: 1   meloxicam (MOBIC) 15 MG tablet, Take 15 mg by mouth daily as needed., Disp: , Rfl:    Multiple Vitamins-Minerals (WOMENS ONE DAILY PO), Take 1 tablet by mouth daily., Disp: , Rfl:    oxybutynin (DITROPAN XL) 10 MG 24 hr tablet, Take 1 tablet (10 mg total) by mouth at bedtime., Disp: 30 tablet, Rfl: 11   zolpidem (AMBIEN CR) 12.5 MG CR tablet, Take 12.5 mg by mouth at bedtime as needed for sleep (sleep). , Disp: , Rfl:   PAST MEDICAL HISTORY: Past Medical History:  Diagnosis Date   Anxiety    Fracture of fifth metatarsal bone of right foot 04/08/2015   History of abnormal cervical Pap smear    History of adenomatous polyp of colon    2005 & 2010   Left ureteral stone    Multiple sclerosis ([place]) dx 2001 (approx.) relapsing-remitting   dr [facility]--  [facility]   Neuromuscular disorder ([place])    MS   PSVT (paroxysmal supraventricular tachycardia) ([place]) 07/2017   PSVT noted on 30-day event monitor.  Several episodes of short bursts lasting less than 10 beats and up to 66 seconds.   Urgency of urination     PAST SURGICAL HISTORY: Past Surgical History:  Procedure Laterality Date   BREAST REDUCTION SURGERY Bilateral 01/12/2021   Procedure: MAMMARY  REDUCTION  (BREAST);  Surgeon: Wallace Going, DO;  Location: [place];  Service: Plastics;  Laterality: Bilateral;   COLONOSCOPY  last one 2015   EXCISION SOFT TISSUE MASS, RIGHT CHEST WALL  02-26-2013   ORIF TOE FRACTURE Right 04/08/2015   Procedure: OPEN REDUCTION INTERNAL FIXATION (ORIF) RIGHT METATARSAL (TOE) FRACTURE;  Surgeon: Marchia Bond, MD;  Location: [place];  Service: Orthopedics;  Laterality: Right;   TRANSTHORACIC ECHOCARDIOGRAM  12-24-2012   Ventricular septum motion showed paradox,  ef 55-60%,   mild MR,  trivial PR and TR    FAMILY HISTORY: Family History  Problem Relation Age of Onset   Cancer Mother        Uterine ca   Heart disease Father 29       Fatal heart attack -> cardiac arrest   Heart attack Father    Hypertension Brother  High Cholesterol Brother    Diabetes Brother    Cancer Brother    Hypertension Brother    Diabetes Mellitus II Brother    High Cholesterol Brother    Stroke Maternal Aunt    Diabetes Paternal Aunt     SOCIAL HISTORY:  Social History   Socioeconomic History   Marital status: Married    Spouse name: Negin Rourke   Number of children: 0   Years of education: Not on file   Highest education level: Not on file  Occupational History   Occupation: retired    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  Tobacco Use   Smoking status: Never   Smokeless tobacco: Never  Substance and Sexual Activity   Alcohol use: Yes    Comment: 3-4 glasses of wine/week   Drug use: No   Sexual activity: Not on file  Other Topics Concern   Not on file  Social History Narrative   Right handed   No caffeine use   Social Determinants of Health   Financial Resource Strain: Not on file  Food Insecurity: Not on file  Transportation Needs: Not on file  Physical Activity: Not on file  Stress: Not on file  Social Connections: Not on file  Intimate Partner Violence: Not on file     PHYSICAL EXAM  There were  no vitals filed for this visit.   There is no height or weight on file to calculate BMI.   General: The patient is well-developed and well-nourished and in no acute distress  HEENT:  Head is [place]/AT.  Sclera are anicteric.   Skin: Extremities are without rash or  edema.   Neurologic Exam  Mental status: The patient is alert and oriented x 3 at the time of the examination. The patient has apparent normal recent and remote memory, with an apparently normal attention span and concentration ability.   Speech is normal.  Cranial nerves: Extraocular movements are full.  There is good facial sensation to soft touch bilaterally.Facial strength is normal.  Trapezius and sternocleidomastoid strength is normal. No dysarthria is noted.  The tongue is midline, and the patient has symmetric elevation of the soft palate. No obvious hearing deficits are noted.  Motor:  Muscle bulk is normal.   Tone is normal. Strength is  5 / 5 in all 4 extremities except 4+/5 left foot.   Sensory: Sensory testing is intact to pinprick, soft touch and vibration sensation in all 4 extremities.  Coordination: Cerebellar testing reveals good finger-nose-finger and heel-to-shin bilaterally.  Gait and station: Station is normal.   Gait is fairly normal.  The tandem gait is wide.  Romberg is negative.   Reflexes: Deep tendon reflexes are symmetric and normal bilaterally.      ASSESSMENT AND PLAN  No diagnosis found.   1.  We will fill out patient assistance form for Betaseron.   Check MRI brain to see if breakthrough activity and consider a different DMT if occurring.  We also dicussed if no change over time, we could also one day stop the Betaseron.   2.   Oxybutynin for bladder 3.    rtc 6 months, sooner if new or worsening issues.    Evangeline Dakin, DNP  [facility] [address] [place], [place] [address] [phone]

## 2023-01-17 ENCOUNTER — Encounter: Payer: Self-pay | Admitting: Neurology

## 2023-01-17 ENCOUNTER — Ambulatory Visit: Payer: Medicare Other | Admitting: Neurology

## 2023-01-28 ENCOUNTER — Ambulatory Visit: Payer: Medicare Other | Admitting: Neurology

## 2023-02-11 DIAGNOSIS — Z1231 Encounter for screening mammogram for malignant neoplasm of breast: Secondary | ICD-10-CM | POA: Diagnosis not present

## 2023-03-04 DIAGNOSIS — R928 Other abnormal and inconclusive findings on diagnostic imaging of breast: Secondary | ICD-10-CM | POA: Diagnosis not present

## 2023-03-04 DIAGNOSIS — R922 Inconclusive mammogram: Secondary | ICD-10-CM | POA: Diagnosis not present

## 2023-03-21 ENCOUNTER — Other Ambulatory Visit: Payer: Self-pay | Admitting: Obstetrics & Gynecology

## 2023-03-27 ENCOUNTER — Ambulatory Visit: Payer: Medicare Other | Admitting: Neurology

## 2023-03-27 ENCOUNTER — Encounter: Payer: Self-pay | Admitting: Neurology

## 2023-03-27 VITALS — BP 130/66 | HR 69 | Ht 66.0 in

## 2023-03-27 DIAGNOSIS — G35 Multiple sclerosis: Secondary | ICD-10-CM

## 2023-03-27 DIAGNOSIS — R3915 Urgency of urination: Secondary | ICD-10-CM | POA: Diagnosis not present

## 2023-03-27 DIAGNOSIS — E559 Vitamin D deficiency, unspecified: Secondary | ICD-10-CM | POA: Diagnosis not present

## 2023-03-27 DIAGNOSIS — R799 Abnormal finding of blood chemistry, unspecified: Secondary | ICD-10-CM | POA: Diagnosis not present

## 2023-03-27 MED ORDER — OXYBUTYNIN CHLORIDE ER 10 MG PO TB24: 10.0000 mg | ORAL_TABLET | Freq: Every day | ORAL | 3 refills | Status: DC

## 2023-03-27 NOTE — Progress Notes (Signed)
Patient: Barbara Ellison Date of Birth: January 14, 1956  Reason for Visit: Follow up for MS History from: Patient Primary Neurologist: [facility]   ASSESSMENT AND PLAN 67 y.o. year old female   1.  Multiple sclerosis 2.  Urinary urgency  -Remains overall stable, will continue Betaseron -Will update routine labs including vitamin D -Continue oxybutynin 10 mg 24-hour tablet at bedtime, discussed trial of Myrbetriq, does not wish to change for now -MRI of the brain in October 2023 showed overall stable MS lesions compared to September 2021 -Encouraged to remain active, exercise -Follow-up in 1 year or sooner if needed  HISTORY OF PRESENT ILLNESS: Today 03/27/23 MRI of the brain October 2023 overall stable MS lesions. Twisted her right ankle wearing heels over the weekend. Waiting to see surgeon Friday, has a screw. Using crutches today. Remains on Betaseron, able to get patient assistance, no cost to her. Feels MS is doing well. Vision is fine, got prescription strength glasses. Sometimes gets intermittent tingling in her face, thinks related to stress. Left leg is slightly weaker. No falls. Gets up 3 times at night to urinate. Has urinary urgency, will have incontinence if not quick enough. Getting back into exercise, does [employer]. Takes daily multivitamin.   HISTORY  Update 07/11/2022: She feels she is doing about the same for the most part but has had some facial tingling and left leg seems slightly weaker.   No definite exacerbations .   She is on Betaseron (since 2004) and tolerates it fairly well.     She denies difficulty with gait or balance and can go up/down strairs without the bannister.    Left leg sometimes feels weak or tires out easily.    Her vision is fine.    She has noticed urinary urgency with rare incontinence.   She was once on a bladder medication but stopped since symptoms were milder.    She denies burning.       She denies much fatigue unless she has a bad night's sleep.   She sleeps ok but sometimes needs to takes Ambien or xanax.   She denies depression .  She has some anxiety and mood swings at times.   She notes short term memory and focus/attention is reduced.         MS History: She was diagnosed with MS in 2004 by Dr. Sandria Manly.  At that time, she presented with numbness and tingling.  She had a Lhermitte sign when she bent her neck.  He had MRIs of the brain and cervical spine.  The MRI of the brain showed a couple T2/FLAIR hyperintense foci including 1 in the corpus callosum that enhanced after contrast.  The MRI of the cervical spine showed 3 lesions, more to the left.  She has not had any exacerbations since the initial presentation.  She was placed on Betaseron.  She continues on that medication and tolerates it well.  She started to see Dr. Tinnie Gens at [facility].  When he moved away she began to see Dr. Renne Crigler.   REVIEW OF SYSTEMS: Out of a complete 14 system review of symptoms, the patient complains only of the following symptoms, and all other reviewed systems are negative.  See HPI  ALLERGIES: No Known Allergies  HOME MEDICATIONS: Outpatient Medications Prior to Visit  Medication Sig Dispense Refill   ALPRAZolam (XANAX) 0.25 MG tablet Take 0.25 mg by mouth as needed for anxiety.     atorvastatin (LIPITOR) 10 MG tablet Take 10  mg by mouth every morning.      BETASERON 0.3 MG KIT injection ADD 1.2 ML DILUENT TO VIAL AND MIX GENTLY. INJECT 1 ML SUBCUTANEOUSLY EVERY OTHER DAY. USE WITHIN 3 HOURS OF MIXING. STORE AT ROOM TEMPERATURE. 14 kit 0   FLUoxetine (PROZAC) 20 MG capsule Take 1 capsule (20 mg total) by mouth daily. 90 capsule 1   meloxicam (MOBIC) 15 MG tablet Take 15 mg by mouth daily as needed.     Multiple Vitamins-Minerals (WOMENS ONE DAILY PO) Take 1 tablet by mouth daily.     zolpidem (AMBIEN CR) 12.5 MG CR tablet Take 12.5 mg by mouth at bedtime as needed for sleep (sleep).      oxybutynin (DITROPAN XL) 10 MG 24 hr tablet Take 1 tablet (10  mg total) by mouth at bedtime. 30 tablet 11   No facility-administered medications prior to visit.    PAST MEDICAL HISTORY: Past Medical History:  Diagnosis Date   Anxiety    Fracture of fifth metatarsal bone of right foot 04/08/2015   History of abnormal cervical Pap smear    History of adenomatous polyp of colon    2005 & 2010   Left ureteral stone    Multiple sclerosis ([facility]) dx 2001 (approx.) relapsing-remitting   dr Irving Burton pharr--  [facility]   Neuromuscular disorder ([facility])    MS   PSVT (paroxysmal supraventricular tachycardia) 07/2017   PSVT noted on 30-day event monitor.  Several episodes of short bursts lasting less than 10 beats and up to 66 seconds.   Urgency of urination     PAST SURGICAL HISTORY: Past Surgical History:  Procedure Laterality Date   BREAST REDUCTION SURGERY Bilateral 01/12/2021   Procedure: MAMMARY REDUCTION  (BREAST);  Surgeon: Peggye Form, DO;  Location: [facility];  Service: Plastics;  Laterality: Bilateral;   COLONOSCOPY  last one 2015   EXCISION SOFT TISSUE MASS, RIGHT CHEST WALL  02-26-2013   ORIF TOE FRACTURE Right 04/08/2015   Procedure: OPEN REDUCTION INTERNAL FIXATION (ORIF) RIGHT METATARSAL (TOE) FRACTURE;  Surgeon: Teryl Lucy, MD;  Location: [facility];  Service: Orthopedics;  Laterality: Right;   TRANSTHORACIC ECHOCARDIOGRAM  12-24-2012   Ventricular septum motion showed paradox,  ef 55-60%,   mild MR,  trivial PR and TR    FAMILY HISTORY: Family History  Problem Relation Age of Onset   Cancer Mother        Uterine ca   Heart disease Father 59       Fatal heart attack -> cardiac arrest   Heart attack Father    Hypertension Brother    High Cholesterol Brother    Diabetes Brother    Cancer Brother    Hypertension Brother    Diabetes Mellitus II Brother    High Cholesterol Brother    Stroke Maternal Aunt    Diabetes Paternal Aunt     SOCIAL HISTORY: Social History   Socioeconomic History    Marital status: Married    Spouse name: Micheal Mathwig   Number of children: 0   Years of education: Not on file   Highest education level: Not on file  Occupational History   Occupation: retired    Associate Professor: Doctor, hospital SERVICE  Tobacco Use   Smoking status: Never   Smokeless tobacco: Never  Substance and Sexual Activity   Alcohol use: Yes    Comment: 3-4 glasses of wine/week   Drug use: No   Sexual activity: Not on file  Other  Topics Concern   Not on file  Social History Narrative   Right handed   No caffeine use   Social Determinants of Health   Financial Resource Strain: Not on file  Food Insecurity: Not on file  Transportation Needs: Not on file  Physical Activity: Not on file  Stress: Not on file  Social Connections: Not on file  Intimate Partner Violence: Not on file    PHYSICAL EXAM  Vitals:   03/27/23 1523  BP: 130/66  Pulse: 69  Height: 5\' 6"  (1.676 m)   Body mass index is 25.76 kg/m.  Generalized: Well developed, in no acute distress  Neurological examination  Mentation: Alert oriented to time, place, history taking. Follows all commands speech and language fluent Cranial nerve II-XII: Pupils were equal round reactive to light. Extraocular movements were full, visual field were full on confrontational test. Facial sensation and strength were normal.  Head turning and shoulder shrug  were normal and symmetric. Motor: The motor testing reveals 5 over 5 strength of all 4 extremities. Good symmetric motor tone is noted throughout.  Sensory: Sensory testing is intact to soft touch on all 4 extremities. No evidence of extinction is noted.  Coordination: Cerebellar testing reveals good finger-nose-finger and heel-to-shin bilaterally.  Gait and station: Gait is cautious, limp on the right from ankle injury, using crutches in the hallway Reflexes: Deep tendon reflexes are symmetric but increased throughout greater on the left  DIAGNOSTIC DATA (LABS, IMAGING,  TESTING) - I reviewed patient records, labs, notes, testing and imaging myself where available.  Lab Results  Component Value Date   WBC 4.4 01/03/2021   HGB 13.3 01/03/2021   HCT 41.0 01/03/2021   MCV 87 01/03/2021   PLT 159 01/03/2021      Component Value Date/Time   NA 141 01/03/2021 1357   K 4.4 01/03/2021 1357   CL 104 01/03/2021 1357   CO2 24 01/03/2021 1357   GLUCOSE 95 01/03/2021 1357   GLUCOSE 123 (H) 01/27/2015 1005   BUN 11 01/03/2021 1357   CREATININE 0.68 01/03/2021 1357   CALCIUM 9.8 01/03/2021 1357   PROT 7.1 01/03/2021 1357   ALBUMIN 4.6 01/03/2021 1357   AST 29 01/03/2021 1357   ALT 31 01/03/2021 1357   ALKPHOS 94 01/03/2021 1357   BILITOT 0.4 01/03/2021 1357   GFRNONAA >90 01/27/2015 1005   GFRAA >90 01/27/2015 1005   No results found for: "CHOL", "HDL", "LDLCALC", "LDLDIRECT", "TRIG", "CHOLHDL" No results found for: "HGBA1C" Lab Results  Component Value Date   VITAMINB12 1,196 01/03/2021   Lab Results  Component Value Date   TSH 0.849 01/03/2021    Margie Ege, AGNP-C, DNP 03/27/2023, 3:47 PM [facility] [address], [address] [place], [place] [address] [phone]

## 2023-03-28 LAB — COMPREHENSIVE METABOLIC PANEL
ALT: 37 IU/L — ABNORMAL HIGH (ref 0–32)
AST: 40 IU/L (ref 0–40)
Albumin/Globulin Ratio: 1.7 (ref 1.2–2.2)
Albumin: 4.3 g/dL (ref 3.9–4.9)
Alkaline Phosphatase: 99 IU/L (ref 44–121)
BUN/Creatinine Ratio: 17 (ref 12–28)
BUN: 11 mg/dL (ref 8–27)
Bilirubin Total: 0.3 mg/dL (ref 0.0–1.2)
CO2: 25 mmol/L (ref 20–29)
Calcium: 10.4 mg/dL — ABNORMAL HIGH (ref 8.7–10.3)
Chloride: 102 mmol/L (ref 96–106)
Creatinine, Ser: 0.66 mg/dL (ref 0.57–1.00)
Globulin, Total: 2.6 g/dL (ref 1.5–4.5)
Glucose: 70 mg/dL (ref 70–99)
Potassium: 4.2 mmol/L (ref 3.5–5.2)
Sodium: 139 mmol/L (ref 134–144)
Total Protein: 6.9 g/dL (ref 6.0–8.5)
eGFR: 97 mL/min/{1.73_m2} (ref >59)

## 2023-03-28 LAB — CBC WITH DIFFERENTIAL/PLATELET
Basophils Absolute: 0 10*3/uL (ref 0.0–0.2)
Basos: 1 %
EOS (ABSOLUTE): 0 10*3/uL (ref 0.0–0.4)
Eos: 1 %
Hematocrit: 40.6 % (ref 34.0–46.6)
Hemoglobin: 12.8 g/dL (ref 11.1–15.9)
Immature Grans (Abs): 0 10*3/uL (ref 0.0–0.1)
Immature Granulocytes: 0 %
Lymphocytes Absolute: 2.5 10*3/uL (ref 0.7–3.1)
Lymphs: 55 %
MCH: 27.5 pg (ref 26.6–33.0)
MCHC: 31.5 g/dL (ref 31.5–35.7)
MCV: 87 fL (ref 79–97)
Monocytes Absolute: 0.4 10*3/uL (ref 0.1–0.9)
Monocytes: 9 %
Neutrophils Absolute: 1.5 10*3/uL (ref 1.4–7.0)
Neutrophils: 34 %
Platelets: 170 10*3/uL (ref 150–450)
RBC: 4.66 x10E6/uL (ref 3.77–5.28)
RDW: 12.9 % (ref 11.7–15.4)
WBC: 4.4 10*3/uL (ref 3.4–10.8)

## 2023-03-28 LAB — VITAMIN D 25 HYDROXY (VIT D DEFICIENCY, FRACTURES): Vit D, 25-Hydroxy: 67.6 ng/mL (ref 30.0–100.0)

## 2023-03-29 DIAGNOSIS — S93601A Unspecified sprain of right foot, initial encounter: Secondary | ICD-10-CM | POA: Diagnosis not present

## 2023-03-29 DIAGNOSIS — M79671 Pain in right foot: Secondary | ICD-10-CM | POA: Diagnosis not present

## 2023-04-01 ENCOUNTER — Telehealth: Payer: Self-pay | Admitting: Neurology

## 2023-04-01 NOTE — Telephone Encounter (Signed)
Pt is asking for a call to discuss her lab results

## 2023-04-01 NOTE — Telephone Encounter (Signed)
Returned call to pt and discussed labs she voiced understanding

## 2023-05-08 DIAGNOSIS — M25562 Pain in left knee: Secondary | ICD-10-CM | POA: Diagnosis not present

## 2023-05-08 DIAGNOSIS — M79671 Pain in right foot: Secondary | ICD-10-CM | POA: Diagnosis not present

## 2023-05-16 DIAGNOSIS — E785 Hyperlipidemia, unspecified: Secondary | ICD-10-CM | POA: Diagnosis not present

## 2023-05-16 DIAGNOSIS — G4700 Insomnia, unspecified: Secondary | ICD-10-CM | POA: Diagnosis not present

## 2023-05-21 ENCOUNTER — Ambulatory Visit (INDEPENDENT_AMBULATORY_CARE_PROVIDER_SITE_OTHER): Payer: Medicare Other | Admitting: Dermatology

## 2023-05-21 ENCOUNTER — Encounter: Payer: Self-pay | Admitting: Dermatology

## 2023-05-21 DIAGNOSIS — D1801 Hemangioma of skin and subcutaneous tissue: Secondary | ICD-10-CM | POA: Diagnosis not present

## 2023-05-21 DIAGNOSIS — B079 Viral wart, unspecified: Secondary | ICD-10-CM | POA: Diagnosis not present

## 2023-05-21 DIAGNOSIS — D489 Neoplasm of uncertain behavior, unspecified: Secondary | ICD-10-CM

## 2023-05-21 NOTE — Progress Notes (Signed)
   New Patient Visit   Subjective  Barbara Ellison is a 67 y.o. female who presents for the following: Nevi  Patient is here to have a nevi evaluated. She has not seen a dermatologist for this complaint before. She has noticed it within the past year. She states that it is rough but not sure if it is a mole. Itches sometimes but denies bleeding or drainage. Has not previously had anything drained   The following portions of the chart were reviewed this encounter and updated as appropriate: medications, allergies, medical history  Review of Systems:  No other skin or systemic complaints except as noted in HPI or Assessment and Plan.  Objective  Well appearing patient in no apparent distress; mood and affect are within normal limits.  A focused examination was performed of the following areas: Mid back  Relevant exam findings are noted in the Assessment and Plan.  Mid Back 5mm Crusted papule     Assessment & Plan    1. Mid-back skin lesion (5mm crusted papule) - Assessment: Suspected irritated seborrheic keratosis vs Verruca. - Plan:    - Perform shave removal for biopsy.    - Send specimen to lab for histopathological examination.    - Instruct patient on wound care: remove Band-Aid before showering, let water run over the area, apply Vaseline, and place a new Band-Aid.    - Follow up with patient via MyChart with biopsy results within a week.  2. Cherry angiomas on the right forearm - Assessment: Patient has cherry angiomas, which are benign and common. - Plan:    - Reassure patient about the benign nature of cherry angiomas.    - No treatment necessary unless desired for cosmetic reasons.    - Provide after-visit summary and patient education on cherry angiomas.  3. Patient education and follow-up - Plan:    - Encourage patient to monitor skin for any changes or new lesions.    - Instruct patient to contact the office if any concerns arise or if biopsy results are not  received within a week.    - Schedule a follow-up appointment as needed based on biopsy results or patient concerns.      PROCEDURE NOTE  Neoplasm of uncertain behavior Mid Back  Skin / nail biopsy Type of biopsy: tangential   Informed consent: discussed and consent obtained   Timeout: patient name, date of birth, surgical site, and procedure verified   Procedure prep:  Patient was prepped and draped in usual sterile fashion Prep type:  Isopropyl alcohol Anesthesia: the lesion was anesthetized in a standard fashion   Anesthetic:  1% lidocaine w/ epinephrine 1-100,000 buffered w/ 8.4% NaHCO3 Instrument used: DermaBlade   Hemostasis achieved with: aluminum chloride   Outcome: patient tolerated procedure well   Post-procedure details: sterile dressing applied and wound care instructions given   Dressing type: petrolatum gauze and bandage    Specimen 1 - Surgical pathology Differential Diagnosis: ISK vs Verruca   Check Margins: No    Return if symptoms worsen or fail to improve.  I, Germaine Pomfret, CMA, am acting as scribe for [employer], DO.   Documentation: I have reviewed the above documentation for accuracy and completeness, and I agree with the above.  Langston Reusing, DO

## 2023-05-21 NOTE — Patient Instructions (Addendum)
Hello Roshni,  Thank you for visiting my office today. I appreciate your commitment to monitoring your skin health and addressing concerns promptly.  Here is a summary of the key points from today's visit:  - Procedure Performed: Removal of a 5mm crusted papule on your mid-back for biopsy. - Possible Diagnoses: Irritated seborrheic keratosis vs. Verruca (wart). - Lab Work: The removed tissue has been sent to the laboratory for analysis to confirm the diagnosis.  - Post-Procedure Care:   - Remove the Band-Aid before showering tomorrow.   - Let water run over the area, then apply Vaseline and a new Band-Aid.   - Continue this care until the area heals.  - Follow-Up: Results from the biopsy will be available in about a week. Please monitor your MyChart for updates or a message from me regarding the lab results.  - Additional Observations: Noted presence of cherry angiomas on your right forearm, which are benign collections of blood vessels.  Please feel free to reach out if you have any questions or concerns while you await your biopsy results or if you notice any changes in your skin condition.         Due to recent changes in healthcare laws, you may see results of your pathology and/or laboratory studies on MyChart before the doctors have had a chance to review them. We understand that in some cases there may be results that are confusing or concerning to you. Please understand that not all results are received at the same time and often the doctors may need to interpret multiple results in order to provide you with the best plan of care or course of treatment. Therefore, we ask that you please give [place] 2 business days to thoroughly review all your results before contacting the office for clarification. Should we see a critical lab result, you will be contacted sooner.   If You Need Anything After Your Visit  If you have any questions or concerns for your doctor, please call our main  line at [phone] If no one answers, please leave a voicemail as directed and we will return your call as soon as possible. Messages left after 4 pm will be answered the following business day.   You may also send [place] a message via MyChart. We typically respond to MyChart messages within 1-2 business days.  For prescription refills, please ask your pharmacy to contact our office. Our fax number is [phone].  If you have an urgent issue when the clinic is closed that cannot wait until the next business day, you can page your doctor at the number below.    Please note that while we do our best to be available for urgent issues outside of office hours, we are not available 24/7.   If you have an urgent issue and are unable to reach [place], you may choose to seek medical care at your doctor's office, retail clinic, urgent care center, or emergency room.  If you have a medical emergency, please immediately call 911 or go to the emergency department. In the event of inclement weather, please call our main line at [phone] for an update on the status of any delays or closures.  Dermatology Medication Tips: Please keep the boxes that topical medications come in in order to help keep track of the instructions about where and how to use these. Pharmacies typically print the medication instructions only on the boxes and not directly on the medication tubes.   If your medication is  too expensive, please contact our office at [phone] or send [place] a message through MyChart.   We are unable to tell what your co-pay for medications will be in advance as this is different depending on your insurance coverage. However, we may be able to find a substitute medication at lower cost or fill out paperwork to get insurance to cover a needed medication.   If a prior authorization is required to get your medication covered by your insurance company, please allow [place] 1-2 business days to complete this  process.  Drug prices often vary depending on where the prescription is filled and some pharmacies may offer cheaper prices.  The website www.goodrx.com contains coupons for medications through different pharmacies. The prices here do not account for what the cost may be with help from insurance (it may be cheaper with your insurance), but the website can give you the price if you did not use any insurance.  - You can print the associated coupon and take it with your prescription to the pharmacy.  - You may also stop by our office during regular business hours and pick up a GoodRx coupon card.  - If you need your prescription sent electronically to a different pharmacy, notify our office through [facility] or by phone at [phone]

## 2023-05-27 ENCOUNTER — Encounter: Payer: Self-pay | Admitting: Dermatology

## 2023-05-28 NOTE — Progress Notes (Signed)
Hi Joreen Nechama Guard  Dr. Onalee Hua reviewed your biopsy results and they showed the spot removed was a benign wart (not cancerous).  No additional treatment is required.  The detailed report is available to view in MyChart.  Have a great day!  Kind Regards,  Dr. Kermit Balo Care Team

## 2023-09-09 ENCOUNTER — Other Ambulatory Visit: Payer: Self-pay | Admitting: *Deleted

## 2023-09-09 DIAGNOSIS — G35 Multiple sclerosis: Secondary | ICD-10-CM

## 2023-09-09 MED ORDER — BETASERON 0.3 MG ~~LOC~~ KIT: PACK | SUBCUTANEOUS | 1 refills | Status: DC

## 2023-09-09 NOTE — Progress Notes (Signed)
Faxed printed/signed rx Betaseron to [employer] [facility] at [phone]. Received fax confirmation.

## 2023-09-10 DIAGNOSIS — R928 Other abnormal and inconclusive findings on diagnostic imaging of breast: Secondary | ICD-10-CM | POA: Diagnosis not present

## 2023-09-10 DIAGNOSIS — R921 Mammographic calcification found on diagnostic imaging of breast: Secondary | ICD-10-CM | POA: Diagnosis not present

## 2023-10-04 ENCOUNTER — Other Ambulatory Visit: Payer: Self-pay | Admitting: Obstetrics & Gynecology

## 2023-10-08 LAB — SURGICAL PATHOLOGY

## 2023-11-06 ENCOUNTER — Telehealth: Payer: Self-pay | Admitting: Neurology

## 2023-11-06 ENCOUNTER — Other Ambulatory Visit (HOSPITAL_COMMUNITY): Payer: Self-pay

## 2023-11-06 ENCOUNTER — Telehealth: Payer: Self-pay

## 2023-11-06 NOTE — Telephone Encounter (Signed)
 Pt called stating that a PA is needed from [employer] [facility] Fax: [phone] for her Interferon Beta-1b (BETASERON) 0.3 MG KIT injection for this year. Pt would like a call back when this has been done. Please advise.

## 2023-11-06 NOTE — Telephone Encounter (Signed)
     I was asked to do a PA for Betaseron-per [facility] no PA needed-this is on the plans covered drug. Got a successful test claim unfortunately cost is $2,000.00

## 2023-11-07 NOTE — Telephone Encounter (Signed)
 I called and LMVM for pt that we are returning call about her betaseron.  High copay,  she has had PAP in the past.  Did she initiate it ?  Please call back when you can.

## 2023-11-11 NOTE — Telephone Encounter (Signed)
Pt returned call. Please call back as soon as available.  °

## 2023-11-12 NOTE — Telephone Encounter (Signed)
 Called pt back. She put in another application to get assistance for Betaseron. They told her our office needs to fill out our portion of application. I printed and aware we will send in once MD signs. Pending MD signature.

## 2023-11-13 NOTE — Telephone Encounter (Signed)
 Faxed prescriber portion of application to [employer] at [phone]. Received fax confirmation.

## 2023-11-21 NOTE — Telephone Encounter (Signed)
Received fax from [facility] that case expired 10/29/23 and new enrollment form needed to be submitted. I called [facility] and spoke w/ Ed, pharmacist. He transferred me to [employer]. Spoke w/ Omelia Blackwater. They spoke with pt yesterday. They only received prescriber portion. They have not received re-enrollment from pt. Pt states she would turn in this portion.   They are just waiting on pt portion at this point from pt. Nothing further needed from our office.

## 2023-11-28 NOTE — Telephone Encounter (Signed)
Marland Kitchen

## 2024-01-21 ENCOUNTER — Other Ambulatory Visit: Payer: Self-pay | Admitting: Neurology

## 2024-03-18 DIAGNOSIS — R928 Other abnormal and inconclusive findings on diagnostic imaging of breast: Secondary | ICD-10-CM | POA: Diagnosis not present

## 2024-03-18 DIAGNOSIS — R921 Mammographic calcification found on diagnostic imaging of breast: Secondary | ICD-10-CM | POA: Diagnosis not present

## 2024-03-19 ENCOUNTER — Telehealth: Payer: Self-pay | Admitting: Neurology

## 2024-03-19 NOTE — Telephone Encounter (Signed)
 r/s appointment Due to a conflict

## 2024-03-26 ENCOUNTER — Ambulatory Visit: Payer: Medicare Other | Admitting: Neurology

## 2024-04-08 DIAGNOSIS — Z0000 Encounter for general adult medical examination without abnormal findings: Secondary | ICD-10-CM | POA: Diagnosis not present

## 2024-04-08 DIAGNOSIS — G4700 Insomnia, unspecified: Secondary | ICD-10-CM | POA: Diagnosis not present

## 2024-04-08 DIAGNOSIS — E785 Hyperlipidemia, unspecified: Secondary | ICD-10-CM | POA: Diagnosis not present

## 2024-04-08 DIAGNOSIS — G35 Multiple sclerosis: Secondary | ICD-10-CM | POA: Diagnosis not present

## 2024-04-24 DIAGNOSIS — M25552 Pain in left hip: Secondary | ICD-10-CM | POA: Diagnosis not present

## 2024-04-24 DIAGNOSIS — R1030 Lower abdominal pain, unspecified: Secondary | ICD-10-CM | POA: Diagnosis not present

## 2024-04-27 DIAGNOSIS — E785 Hyperlipidemia, unspecified: Secondary | ICD-10-CM | POA: Diagnosis not present

## 2024-05-14 ENCOUNTER — Ambulatory Visit: Admitting: Neurology

## 2024-05-14 ENCOUNTER — Encounter: Payer: Self-pay | Admitting: Neurology

## 2024-05-14 VITALS — BP 111/71 | HR 83 | Ht 66.0 in | Wt 167.0 lb

## 2024-05-14 DIAGNOSIS — G35 Multiple sclerosis: Secondary | ICD-10-CM | POA: Diagnosis not present

## 2024-05-14 DIAGNOSIS — R3915 Urgency of urination: Secondary | ICD-10-CM | POA: Diagnosis not present

## 2024-05-14 MED ORDER — MIRABEGRON ER 25 MG PO TB24: 25.0000 mg | ORAL_TABLET | Freq: Every day | ORAL | 6 refills | Status: AC

## 2024-05-14 NOTE — Patient Instructions (Signed)
 Continue Betaseron , stop oxybutynin , try Myrbetriq  for the bladder, plan to repeat MRI brain in October. Call for any issues. Exercise, be active. Follow up in 1 year. Thanks!!  Meds ordered this encounter  Medications   mirabegron  ER (MYRBETRIQ ) 25 MG TB24 tablet    Sig: Take 1 tablet (25 mg total) by mouth daily.    Dispense:  30 tablet    Refill:  6

## 2024-05-14 NOTE — Progress Notes (Signed)
 Patient: Barbara Ellison Date of Birth: 09-Nov-1955  Reason for Visit: Follow up for MS History from: Patient Primary Neurologist: Sater   ASSESSMENT AND PLAN 68 y.o. year old female   1.  Multiple sclerosis 2.  Urinary urgency  -Remains overall stable, will continue Betaseron  -Plan to repeat MRI brain in October for surveillance, I will contact her, labs with PCP CMP was normal  -Switch to Myrbetriq  25 mg tablet daily for urinary urgency, stop oxybutynin  due to lack of efficacy as well as side effect  -Will update routine labs including vitamin D  -MRI of the brain in October 2023 showed overall stable MS lesions compared to September 2021 -Encouraged to remain active, exercise -Follow-up in 1 year or sooner if needed with Dr. Vear to rotate every few visits with me  HISTORY OF PRESENT ILLNESS: Today 05/14/24 More trouble with urinary urgency, frequency. On Oxybutynin . Does have dry mouth. MS is stable on Betaseron , consistent with injections. Vision little worse every year with glasses. Left side is weaker at baseline, stable. Balance and gait is good. Getting back into exercise. Last year labs Vitamin D  67.6. CBC normal, CMP calcium 10.4, ALT 37.  03/27/23 SS: MRI of the brain October 2023 overall stable MS lesions. Twisted her right ankle wearing heels over the weekend. Waiting to see surgeon Friday, has a screw. Using crutches today. Remains on Betaseron , able to get patient assistance, no cost to her. Feels MS is doing well. Vision is fine, got prescription strength glasses. Sometimes gets intermittent tingling in her face, thinks related to stress. Left leg is slightly weaker. No falls. Gets up 3 times at night to urinate. Has urinary urgency, will have incontinence if not quick enough. Getting back into exercise, does You Tube. Takes daily multivitamin.   HISTORY  Update 07/11/2022: She feels she is doing about the same for the most part but has had some facial tingling and left  leg seems slightly weaker.   No definite exacerbations .   She is on Betaseron  (since 2004) and tolerates it fairly well.     She denies difficulty with gait or balance and can go up/down strairs without the bannister.    Left leg sometimes feels weak or tires out easily.    Her vision is fine.    She has noticed urinary urgency with rare incontinence.   She was once on a bladder medication but stopped since symptoms were milder.    She denies burning.       She denies much fatigue unless she has a bad night's sleep.  She sleeps ok but sometimes needs to takes Ambien or xanax.   She denies depression .  She has some anxiety and mood swings at times.   She notes short term memory and focus/attention is reduced.         MS History: She was diagnosed with MS in 2004 by Dr. Maurice.  At that time, she presented with numbness and tingling.  She had a Lhermitte sign when she bent her neck.  He had MRIs of the brain and cervical spine.  The MRI of the brain showed a couple T2/FLAIR hyperintense foci including 1 in the corpus callosum that enhanced after contrast.  The MRI of the cervical spine showed 3 lesions, more to the left.  She has not had any exacerbations since the initial presentation.  She was placed on Betaseron .  She continues on that medication and tolerates it well.  She started to see  Dr. Reyes at [facility].  When he moved away she began to see Dr. Clarice.   REVIEW OF SYSTEMS: Out of a complete 14 system review of symptoms, the patient complains only of the following symptoms, and all other reviewed systems are negative.  See HPI  ALLERGIES: No Known Allergies  HOME MEDICATIONS: Outpatient Medications Prior to Visit  Medication Sig Dispense Refill   ALPRAZolam (XANAX) 0.25 MG tablet Take 0.25 mg by mouth as needed for anxiety.     atorvastatin (LIPITOR) 10 MG tablet Take 10 mg by mouth every morning.      FLUoxetine  (PROZAC ) 20 MG capsule Take 1 capsule (20 mg total) by mouth daily. 90  capsule 1   Interferon Beta-1b  (BETASERON ) 0.3 MG KIT injection ADD 1.2 ML DILUENT  TO VIAL AND MIX GENTLY. INJECT 1 ML SUBCUTANEOUSLY EVERY OTHER DAY. USE WITHIN 3 HOURS OF MIXING. STORE AT ROOM TEMPERATURE. 14 kit 1   meloxicam (MOBIC) 15 MG tablet Take 15 mg by mouth daily as needed.     Multiple Vitamins-Minerals (WOMENS ONE DAILY PO) Take 1 tablet by mouth daily.     oxybutynin  (DITROPAN -XL) 10 MG 24 hr tablet TAKE 1 TABLET BY MOUTH AT  BEDTIME 100 tablet 1   zolpidem (AMBIEN CR) 12.5 MG CR tablet Take 12.5 mg by mouth at bedtime as needed for sleep (sleep).      No facility-administered medications prior to visit.    PAST MEDICAL HISTORY: Past Medical History:  Diagnosis Date   Anxiety    Fracture of fifth metatarsal bone of right foot 04/08/2015   History of abnormal cervical Pap smear    History of adenomatous polyp of colon    2005 & 2010   Left ureteral stone    Multiple sclerosis (HCC) dx 2001 (approx.) relapsing-remitting   dr damien pharr--  [facility]   Neuromuscular disorder (HCC)    MS   PSVT (paroxysmal supraventricular tachycardia) (HCC) 07/2017   PSVT noted on 30-day event monitor.  Several episodes of short bursts lasting less than 10 beats and up to 66 seconds.   Urgency of urination     PAST SURGICAL HISTORY: Past Surgical History:  Procedure Laterality Date   BREAST REDUCTION SURGERY Bilateral 01/12/2021   Procedure: MAMMARY REDUCTION  (BREAST);  Surgeon: Lowery Estefana RAMAN, DO;  Location: [facility];  Service: Plastics;  Laterality: Bilateral;   COLONOSCOPY  last one 2015   EXCISION SOFT TISSUE MASS, RIGHT CHEST WALL  02-26-2013   ORIF TOE FRACTURE Right 04/08/2015   Procedure: OPEN REDUCTION INTERNAL FIXATION (ORIF) RIGHT METATARSAL (TOE) FRACTURE;  Surgeon: Fonda Olmsted, MD;  Location: [facility];  Service: Orthopedics;  Laterality: Right;   TRANSTHORACIC ECHOCARDIOGRAM  12-24-2012   Ventricular septum motion showed paradox,  ef  55-60%,   mild MR,  trivial PR and TR    FAMILY HISTORY: Family History  Problem Relation Age of Onset   Cancer Mother        Uterine ca   Heart disease Father 26       Fatal heart attack -> cardiac arrest   Heart attack Father    Hypertension Brother    High Cholesterol Brother    Diabetes Brother    Cancer Brother    Hypertension Brother    Diabetes Mellitus II Brother    High Cholesterol Brother    Stroke Maternal Aunt    Diabetes Paternal Aunt     SOCIAL HISTORY: Social History   Socioeconomic History  Marital status: Married    Spouse name: Ezmae Speers   Number of children: 0   Years of education: Not on file   Highest education level: Not on file  Occupational History   Occupation: retired    Associate Professor: Doctor, hospital SERVICE  Tobacco Use   Smoking status: Never   Smokeless tobacco: Never  Substance and Sexual Activity   Alcohol use: Yes    Comment: 3-4 glasses of wine/week   Drug use: No   Sexual activity: Not on file  Other Topics Concern   Not on file  Social History Narrative   Right handed   No caffeine use   Social Drivers of Corporate investment banker Strain: Not on file  Food Insecurity: Not on file  Transportation Needs: Not on file  Physical Activity: Not on file  Stress: Not on file  Social Connections: Not on file  Intimate Partner Violence: Not on file   PHYSICAL EXAM  Vitals:   05/14/24 1533  BP: 111/71  Pulse: 83  Weight: 167 lb (75.8 kg)  Height: 5' 6 (1.676 m)    Body mass index is 26.95 kg/m.  Generalized: Well developed, in no acute distress  Neurological examination  Mentation: Alert oriented to time, place, history taking. Follows all commands speech and language fluent Cranial nerve II-XII: Pupils were equal round reactive to light. Extraocular movements were full, visual field were full on confrontational test. Facial sensation and strength were normal.  Head turning and shoulder shrug  were normal and  symmetric. Motor: The motor testing reveals 5 over 5 strength of all 4 extremities. Good symmetric motor tone is noted throughout.  Sensory: Sensory testing is intact to soft touch on all 4 extremities. No evidence of extinction is noted.  Coordination: Cerebellar testing reveals good finger-nose-finger and heel-to-shin bilaterally.  Gait and station: Gait is steady and normal  Reflexes: Deep tendon reflexes are symmetric but increased throughout greater on the left  DIAGNOSTIC DATA (LABS, IMAGING, TESTING) - I reviewed patient records, labs, notes, testing and imaging myself where available.  Lab Results  Component Value Date   WBC 4.4 03/27/2023   HGB 12.8 03/27/2023   HCT 40.6 03/27/2023   MCV 87 03/27/2023   PLT 170 03/27/2023      Component Value Date/Time   NA 139 03/27/2023 1551   K 4.2 03/27/2023 1551   CL 102 03/27/2023 1551   CO2 25 03/27/2023 1551   GLUCOSE 70 03/27/2023 1551   GLUCOSE 123 (H) 01/27/2015 1005   BUN 11 03/27/2023 1551   CREATININE 0.66 03/27/2023 1551   CALCIUM 10.4 (H) 03/27/2023 1551   PROT 6.9 03/27/2023 1551   ALBUMIN 4.3 03/27/2023 1551   AST 40 03/27/2023 1551   ALT 37 (H) 03/27/2023 1551   ALKPHOS 99 03/27/2023 1551   BILITOT 0.3 03/27/2023 1551   GFRNONAA >90 01/27/2015 1005   GFRAA >90 01/27/2015 1005   No results found for: CHOL, HDL, LDLCALC, LDLDIRECT, TRIG, CHOLHDL No results found for: YHAJ8R Lab Results  Component Value Date   VITAMINB12 1,196 01/03/2021   Lab Results  Component Value Date   TSH 0.849 01/03/2021    Lauraine Born, AGNP-C, DNP 05/14/2024, 3:47 PM [facility] [address] [place], [place] [address] [phone]

## 2024-05-20 ENCOUNTER — Encounter: Payer: Self-pay | Admitting: Neurology

## 2024-05-28 DIAGNOSIS — E785 Hyperlipidemia, unspecified: Secondary | ICD-10-CM | POA: Diagnosis not present

## 2024-06-03 DIAGNOSIS — R921 Mammographic calcification found on diagnostic imaging of breast: Secondary | ICD-10-CM | POA: Diagnosis not present

## 2024-06-03 DIAGNOSIS — R350 Frequency of micturition: Secondary | ICD-10-CM | POA: Diagnosis not present

## 2024-06-05 DIAGNOSIS — Z86018 Personal history of other benign neoplasm: Secondary | ICD-10-CM | POA: Diagnosis not present

## 2024-06-28 DIAGNOSIS — E785 Hyperlipidemia, unspecified: Secondary | ICD-10-CM | POA: Diagnosis not present

## 2024-07-08 ENCOUNTER — Ambulatory Visit: Admitting: Neurology

## 2024-07-22 DIAGNOSIS — K5730 Diverticulosis of large intestine without perforation or abscess without bleeding: Secondary | ICD-10-CM | POA: Diagnosis not present

## 2024-07-22 DIAGNOSIS — Z09 Encounter for follow-up examination after completed treatment for conditions other than malignant neoplasm: Secondary | ICD-10-CM | POA: Diagnosis not present

## 2024-07-22 DIAGNOSIS — Z860101 Personal history of adenomatous and serrated colon polyps: Secondary | ICD-10-CM | POA: Diagnosis not present

## 2024-07-22 DIAGNOSIS — Z800 Family history of malignant neoplasm of digestive organs: Secondary | ICD-10-CM | POA: Diagnosis not present

## 2024-07-28 DIAGNOSIS — E785 Hyperlipidemia, unspecified: Secondary | ICD-10-CM | POA: Diagnosis not present

## 2024-07-29 ENCOUNTER — Telehealth: Payer: Self-pay | Admitting: Neurology

## 2024-07-29 DIAGNOSIS — G35D Multiple sclerosis, unspecified: Secondary | ICD-10-CM

## 2024-07-29 NOTE — Telephone Encounter (Signed)
Orders Placed This Encounter  Procedures   MR BRAIN WO CONTRAST      

## 2024-07-30 ENCOUNTER — Telehealth: Payer: Self-pay | Admitting: Neurology

## 2024-07-30 NOTE — Telephone Encounter (Signed)
 no auth required sent to [facility] [phone]

## 2024-08-02 ENCOUNTER — Other Ambulatory Visit: Payer: Self-pay | Admitting: Neurology

## 2024-08-05 NOTE — Telephone Encounter (Signed)
 The original prescription was discontinued on 05/14/2024 by Gayland Lauraine PARAS, NP.

## 2024-08-20 ENCOUNTER — Telehealth: Payer: Self-pay | Admitting: Neurology

## 2024-08-20 NOTE — Telephone Encounter (Signed)
 Inquiry about order placed for MRI

## 2024-09-09 ENCOUNTER — Inpatient Hospital Stay
Admission: RE | Admit: 2024-09-09 | Discharge: 2024-09-09 | Disposition: A | Source: Ambulatory Visit | Attending: Neurology | Admitting: Neurology

## 2024-09-09 DIAGNOSIS — G35D Multiple sclerosis, unspecified: Secondary | ICD-10-CM

## 2024-09-09 DIAGNOSIS — G35A Relapsing-remitting multiple sclerosis: Secondary | ICD-10-CM | POA: Diagnosis not present

## 2024-09-11 ENCOUNTER — Ambulatory Visit: Payer: Self-pay | Admitting: Neurology

## 2024-11-25 ENCOUNTER — Other Ambulatory Visit: Payer: Self-pay | Admitting: *Deleted

## 2024-11-25 DIAGNOSIS — G35D Multiple sclerosis, unspecified: Secondary | ICD-10-CM

## 2024-11-25 MED ORDER — BETASERON 0.3 MG ~~LOC~~ KIT: PACK | SUBCUTANEOUS | 1 refills | Status: AC

## 2024-11-25 NOTE — Telephone Encounter (Signed)
 Last seen on 05/14/24  Follow up scheduled on 05/20/25

## 2025-05-20 ENCOUNTER — Ambulatory Visit: Admitting: Neurology
